# Patient Record
Sex: Female | Born: 1935 | Race: White | Hispanic: No | Smoking: Never smoker
Health system: Southern US, Community
[De-identification: ages and names within clinical notes are randomized; demographics above are authoritative.]

## PROBLEM LIST (undated history)

## (undated) ENCOUNTER — Emergency Department (HOSPITAL_COMMUNITY): Discharge status: Against Medical Advice | Payer: Medicare Other

## (undated) DIAGNOSIS — E785 Hyperlipidemia, unspecified: Secondary | ICD-10-CM

## (undated) DIAGNOSIS — F0390 Unspecified dementia without behavioral disturbance: Secondary | ICD-10-CM

## (undated) DIAGNOSIS — K219 Gastro-esophageal reflux disease without esophagitis: Secondary | ICD-10-CM

## (undated) HISTORY — PX: ABDOMINAL HYSTERECTOMY: SHX81

---

## 2002-03-14 ENCOUNTER — Ambulatory Visit (HOSPITAL_COMMUNITY): Admission: RE | Admit: 2002-03-14 | Discharge: 2002-03-14 | Payer: Self-pay | Admitting: Family Medicine

## 2002-03-14 ENCOUNTER — Encounter: Payer: Self-pay | Admitting: Family Medicine

## 2002-03-15 ENCOUNTER — Encounter: Payer: Self-pay | Admitting: Family Medicine

## 2002-03-15 ENCOUNTER — Ambulatory Visit (HOSPITAL_COMMUNITY): Admission: RE | Admit: 2002-03-15 | Discharge: 2002-03-15 | Payer: Self-pay | Admitting: Family Medicine

## 2002-03-23 ENCOUNTER — Ambulatory Visit (HOSPITAL_COMMUNITY): Admission: RE | Admit: 2002-03-23 | Discharge: 2002-03-23 | Payer: Self-pay | Admitting: General Surgery

## 2002-09-29 ENCOUNTER — Emergency Department (HOSPITAL_COMMUNITY): Admission: EM | Admit: 2002-09-29 | Discharge: 2002-09-29 | Payer: Self-pay | Admitting: Emergency Medicine

## 2002-09-29 ENCOUNTER — Encounter: Payer: Self-pay | Admitting: Emergency Medicine

## 2004-05-29 ENCOUNTER — Other Ambulatory Visit: Admission: RE | Admit: 2004-05-29 | Discharge: 2004-05-29 | Payer: Self-pay | Admitting: Dermatology

## 2005-11-26 ENCOUNTER — Other Ambulatory Visit: Admission: RE | Admit: 2005-11-26 | Discharge: 2005-11-26 | Payer: Self-pay | Admitting: Dermatology

## 2007-07-05 ENCOUNTER — Ambulatory Visit (HOSPITAL_COMMUNITY): Admission: RE | Admit: 2007-07-05 | Discharge: 2007-07-05 | Payer: Self-pay | Admitting: Family Medicine

## 2007-08-03 ENCOUNTER — Encounter (HOSPITAL_COMMUNITY): Admission: RE | Admit: 2007-08-03 | Discharge: 2007-09-02 | Payer: Self-pay | Admitting: Orthopaedic Surgery

## 2007-09-04 ENCOUNTER — Encounter (HOSPITAL_COMMUNITY): Admission: RE | Admit: 2007-09-04 | Discharge: 2007-09-20 | Payer: Self-pay | Admitting: Orthopaedic Surgery

## 2008-06-16 ENCOUNTER — Emergency Department (HOSPITAL_COMMUNITY): Admission: EM | Admit: 2008-06-16 | Discharge: 2008-06-16 | Payer: Self-pay | Admitting: Emergency Medicine

## 2010-08-02 ENCOUNTER — Emergency Department (HOSPITAL_COMMUNITY)
Admission: EM | Admit: 2010-08-02 | Discharge: 2010-08-02 | Payer: Self-pay | Source: Home / Self Care | Admitting: Emergency Medicine

## 2011-01-11 ENCOUNTER — Encounter: Payer: Self-pay | Admitting: Orthopedic Surgery

## 2011-04-11 ENCOUNTER — Emergency Department (HOSPITAL_COMMUNITY): Payer: Medicare Other

## 2011-04-11 ENCOUNTER — Emergency Department (HOSPITAL_COMMUNITY)
Admission: EM | Admit: 2011-04-11 | Discharge: 2011-04-11 | Disposition: A | Discharge status: Home / Self Care | Payer: Medicare Other | Attending: Emergency Medicine | Admitting: Emergency Medicine

## 2011-04-11 DIAGNOSIS — S022XXA Fracture of nasal bones, initial encounter for closed fracture: Secondary | ICD-10-CM | POA: Insufficient documentation

## 2011-04-11 DIAGNOSIS — M542 Cervicalgia: Secondary | ICD-10-CM | POA: Insufficient documentation

## 2011-04-11 DIAGNOSIS — R51 Headache: Secondary | ICD-10-CM | POA: Insufficient documentation

## 2011-04-11 DIAGNOSIS — W1809XA Striking against other object with subsequent fall, initial encounter: Secondary | ICD-10-CM | POA: Insufficient documentation

## 2011-04-11 DIAGNOSIS — W19XXXA Unspecified fall, initial encounter: Secondary | ICD-10-CM

## 2011-04-11 DIAGNOSIS — S0990XA Unspecified injury of head, initial encounter: Secondary | ICD-10-CM

## 2011-05-08 NOTE — H&P (Signed)
[facility]  Patient:    Marisa Lawrence, Marisa Lawrence Visit Number: [id number] MRN: [id number]          Service Type: END Location: DAY Attending Physician:  Dalia Heading Dictated by:   Franky Macho, M.D. Admit Date:  03/23/2002 Discharge Date: 03/23/2002   CC:         Franky Macho, M.D.  Patients chart  Colette Ribas, M.D.   History and Physical  DATE OF BIRTH: [birth year]  CHIEF COMPLAINT:  Gastroesophageal reflux disease.  HISTORY OF PRESENT ILLNESS:  The patient is a 77 year old white female who was referred for evaluation and treatment of GERD.  She has been having intermittent episodes of heartburn for many years.  She states it is somewhat better with Protonix.  No fever or chills have been noted.  No nausea, vomiting, diarrhea, constipation, or epigastric pain have been noted.  No hematochezia has been noted.  PAST MEDICAL HISTORY:  High cholesterol levels.  GERD.  PAST SURGICAL HISTORY:  Unremarkable.  CURRENT MEDICATIONS:  Lipitor, Celebrex, Hydroxyzine, Aciphex.  ALLERGIES:  None.  REVIEW OF SYSTEMS:  Unremarkable.  PHYSICAL EXAMINATION:  GENERAL:  The patient is a well-developed, well-nourished white female in no acute distress.  VITAL SIGNS:  She is afebrile.  Vital signs are stable.  LUNGS:  Clear to auscultation with equal breath sounds bilaterally.  HEART:  Regular rate and rhythm without S3, S4, or murmurs.  ABDOMEN:  Soft, nontender, and nondistended.  No hepatosplenomegaly, masses, or hernias identified.  IMPRESSION:  Gastroesophageal reflux disease.  PLAN:  The patient is scheduled for an EGD on March 23, 2002.  The risks and benefits of the procedure of the procedure including bleeding and perforation were fully explained to and gave informed consent. Dictated by:   Franky Macho, M.D. Attending Physician:  Dalia Heading DD:  03/21/02 TD:  03/21/02 Job: [id number]

## 2011-09-17 LAB — BASIC METABOLIC PANEL
BUN: 16
Calcium: 9.9
GFR calc non Af Amer: >60
Glucose, Bld: 117 — ABNORMAL HIGH
Potassium: 4.1

## 2011-09-17 LAB — DIFFERENTIAL
Basophils Absolute: 0
Eosinophils Relative: 1
Lymphocytes Relative: 12
Lymphs Abs: 1
Neutro Abs: 6.7
Neutrophils Relative %: 81 — ABNORMAL HIGH

## 2011-09-17 LAB — CBC
HCT: 42.3
Platelets: 220
RDW: 13.3
WBC: 8.3

## 2012-04-08 DIAGNOSIS — I10 Essential (primary) hypertension: Secondary | ICD-10-CM | POA: Diagnosis not present

## 2012-04-08 DIAGNOSIS — E119 Type 2 diabetes mellitus without complications: Secondary | ICD-10-CM | POA: Diagnosis not present

## 2012-04-08 DIAGNOSIS — D649 Anemia, unspecified: Secondary | ICD-10-CM | POA: Diagnosis not present

## 2012-04-08 DIAGNOSIS — E559 Vitamin D deficiency, unspecified: Secondary | ICD-10-CM | POA: Diagnosis not present

## 2012-04-08 DIAGNOSIS — Z79899 Other long term (current) drug therapy: Secondary | ICD-10-CM | POA: Diagnosis not present

## 2012-04-08 DIAGNOSIS — R5381 Other malaise: Secondary | ICD-10-CM | POA: Diagnosis not present

## 2012-04-08 DIAGNOSIS — E785 Hyperlipidemia, unspecified: Secondary | ICD-10-CM | POA: Diagnosis not present

## 2012-04-08 DIAGNOSIS — R5383 Other fatigue: Secondary | ICD-10-CM | POA: Diagnosis not present

## 2012-04-08 DIAGNOSIS — IMO0002 Reserved for concepts with insufficient information to code with codable children: Secondary | ICD-10-CM | POA: Diagnosis not present

## 2012-05-04 DIAGNOSIS — L219 Seborrheic dermatitis, unspecified: Secondary | ICD-10-CM | POA: Diagnosis not present

## 2012-05-04 DIAGNOSIS — C44111 Basal cell carcinoma of skin of unspecified eyelid, including canthus: Secondary | ICD-10-CM | POA: Diagnosis not present

## 2012-06-28 DIAGNOSIS — C44111 Basal cell carcinoma of skin of unspecified eyelid, including canthus: Secondary | ICD-10-CM | POA: Diagnosis not present

## 2013-01-27 DIAGNOSIS — E785 Hyperlipidemia, unspecified: Secondary | ICD-10-CM | POA: Diagnosis not present

## 2013-01-27 DIAGNOSIS — IMO0002 Reserved for concepts with insufficient information to code with codable children: Secondary | ICD-10-CM | POA: Diagnosis not present

## 2013-01-27 DIAGNOSIS — I10 Essential (primary) hypertension: Secondary | ICD-10-CM | POA: Diagnosis not present

## 2013-01-27 DIAGNOSIS — R7301 Impaired fasting glucose: Secondary | ICD-10-CM | POA: Diagnosis not present

## 2013-08-17 DIAGNOSIS — Z85828 Personal history of other malignant neoplasm of skin: Secondary | ICD-10-CM | POA: Diagnosis not present

## 2013-08-17 DIAGNOSIS — C44319 Basal cell carcinoma of skin of other parts of face: Secondary | ICD-10-CM | POA: Diagnosis not present

## 2013-08-17 DIAGNOSIS — L570 Actinic keratosis: Secondary | ICD-10-CM | POA: Diagnosis not present

## 2014-01-25 ENCOUNTER — Emergency Department (HOSPITAL_COMMUNITY): Payer: Medicare Other

## 2014-01-25 ENCOUNTER — Encounter (HOSPITAL_COMMUNITY): Payer: Self-pay | Admitting: Emergency Medicine

## 2014-01-25 ENCOUNTER — Emergency Department (HOSPITAL_COMMUNITY)
Admission: EM | Admit: 2014-01-25 | Discharge: 2014-01-25 | Disposition: A | Discharge status: Home / Self Care | Payer: Medicare Other | Attending: Emergency Medicine | Admitting: Emergency Medicine

## 2014-01-25 DIAGNOSIS — Z862 Personal history of diseases of the blood and blood-forming organs and certain disorders involving the immune mechanism: Secondary | ICD-10-CM | POA: Insufficient documentation

## 2014-01-25 DIAGNOSIS — R197 Diarrhea, unspecified: Secondary | ICD-10-CM | POA: Diagnosis not present

## 2014-01-25 DIAGNOSIS — R059 Cough, unspecified: Secondary | ICD-10-CM | POA: Diagnosis not present

## 2014-01-25 DIAGNOSIS — K219 Gastro-esophageal reflux disease without esophagitis: Secondary | ICD-10-CM | POA: Insufficient documentation

## 2014-01-25 DIAGNOSIS — Z79899 Other long term (current) drug therapy: Secondary | ICD-10-CM | POA: Diagnosis not present

## 2014-01-25 DIAGNOSIS — F0390 Unspecified dementia without behavioral disturbance: Secondary | ICD-10-CM | POA: Diagnosis not present

## 2014-01-25 DIAGNOSIS — R05 Cough: Secondary | ICD-10-CM | POA: Diagnosis not present

## 2014-01-25 DIAGNOSIS — J069 Acute upper respiratory infection, unspecified: Secondary | ICD-10-CM | POA: Diagnosis not present

## 2014-01-25 DIAGNOSIS — R5383 Other fatigue: Secondary | ICD-10-CM

## 2014-01-25 DIAGNOSIS — R5381 Other malaise: Secondary | ICD-10-CM | POA: Insufficient documentation

## 2014-01-25 DIAGNOSIS — Z8639 Personal history of other endocrine, nutritional and metabolic disease: Secondary | ICD-10-CM | POA: Insufficient documentation

## 2014-01-25 HISTORY — DX: Hyperlipidemia, unspecified: E78.5

## 2014-01-25 HISTORY — DX: Gastro-esophageal reflux disease without esophagitis: K21.9

## 2014-01-25 HISTORY — DX: Unspecified dementia, unspecified severity, without behavioral disturbance, psychotic disturbance, mood disturbance, and anxiety: F03.90

## 2014-01-25 LAB — CBC WITH DIFFERENTIAL/PLATELET
BASOS PCT: 0 % (ref 0–1)
Basophils Absolute: 0 10*3/uL (ref 0.0–0.1)
EOS ABS: 0 10*3/uL (ref 0.0–0.7)
Eosinophils Relative: 0 % (ref 0–5)
HCT: 42.9 % (ref 36.0–46.0)
Hemoglobin: 14.9 g/dL (ref 12.0–15.0)
Lymphocytes Relative: 19 % (ref 12–46)
Lymphs Abs: 0.8 10*3/uL (ref 0.7–4.0)
MCH: 31.2 pg (ref 26.0–34.0)
MCHC: 34.7 g/dL (ref 30.0–36.0)
MCV: 89.7 fL (ref 78.0–100.0)
MONOS PCT: 9 % (ref 3–12)
Monocytes Absolute: 0.4 10*3/uL (ref 0.1–1.0)
NEUTROS PCT: 72 % (ref 43–77)
Neutro Abs: 3.1 10*3/uL (ref 1.7–7.7)
PLATELETS: 162 10*3/uL (ref 150–400)
RBC: 4.78 MIL/uL (ref 3.87–5.11)
RDW: 13.3 % (ref 11.5–15.5)
WBC: 4.4 10*3/uL (ref 4.0–10.5)

## 2014-01-25 LAB — BASIC METABOLIC PANEL
BUN: 19 mg/dL (ref 6–23)
CO2: 24 mEq/L (ref 19–32)
Calcium: 9.3 mg/dL (ref 8.4–10.5)
Chloride: 95 mEq/L — ABNORMAL LOW (ref 96–112)
Creatinine, Ser: 0.97 mg/dL (ref 0.50–1.10)
GFR, EST AFRICAN AMERICAN: 61 mL/min — AB (ref >90)
GFR, EST NON AFRICAN AMERICAN: 53 mL/min — AB (ref >90)
Glucose, Bld: 107 mg/dL — ABNORMAL HIGH (ref 70–99)
POTASSIUM: 3.8 meq/L (ref 3.7–5.3)
SODIUM: 134 meq/L — AB (ref 137–147)

## 2014-01-25 LAB — URINALYSIS, ROUTINE W REFLEX MICROSCOPIC
BILIRUBIN URINE: NEGATIVE
Glucose, UA: NEGATIVE mg/dL
Hgb urine dipstick: NEGATIVE
Ketones, ur: NEGATIVE mg/dL
Leukocytes, UA: NEGATIVE
NITRITE: NEGATIVE
PROTEIN: NEGATIVE mg/dL
UROBILINOGEN UA: 1 mg/dL (ref 0.0–1.0)
pH: 5.5 (ref 5.0–8.0)

## 2014-01-25 MED ORDER — OSELTAMIVIR PHOSPHATE 75 MG PO CAPS: 75.0000 mg | ORAL_CAPSULE | Freq: Two times a day (BID) | ORAL | Status: DC

## 2014-01-25 MED ORDER — SODIUM CHLORIDE 0.9 % IV BOLUS (SEPSIS)
250.0000 mL | Freq: Once | INTRAVENOUS | Status: AC
  Administered 2014-01-25: 250 mL via INTRAVENOUS

## 2014-01-25 MED ORDER — OSELTAMIVIR PHOSPHATE 75 MG PO CAPS
75.0000 mg | ORAL_CAPSULE | Freq: Once | ORAL | Status: DC
  Filled 2014-01-25: qty 1

## 2014-01-25 NOTE — ED Notes (Signed)
Patient given tamiflu.before leaving....scanner in room does not work

## 2014-01-25 NOTE — ED Notes (Signed)
Patient is back from radiology

## 2014-01-25 NOTE — ED Notes (Signed)
Family reporting URI symptoms and generalized weakness for past 2 days.

## 2014-01-25 NOTE — ED Provider Notes (Signed)
CSN: [id number]     Arrival date & time 01/25/14  2104 History  This chart was scribed for Marisa Greek, MD by Roxan Diesel, ED scribe.  This patient was seen in room APA01/APA01 and the patient's care was started at 9:19 PM.   Chief Complaint  Patient presents with  . URI    The history is provided by a relative. No language interpreter was used.    HPI Comments: Marisa Lawrence is a 80 y.o. female who presents to the Emergency Department complaining of several days of persistent worsening URI symptoms including dry cough, rhinorrhea, and decreased appetite.  Family states that over the past several hours pt has also had worsening generalized weakness.  They also have noticed she seems "swelled under her kneecaps."   She had a small amount of diarrhea yesterday but none since then.  Family denies audible chest congestion or productive cough.  They deny vomiting or measured fever at home.  ED temperature is 99.7 F.    Past Medical History  Diagnosis Date  . Dementia   . Hyperlipidemia   . Acid reflux     History reviewed. No pertinent past surgical history.  History reviewed. No pertinent family history.   History  Substance Use Topics  . Smoking status: Never Smoker   . Smokeless tobacco: Not on file  . Alcohol Use: No    OB History   Grav Para Term Preterm Abortions TAB SAB Ect Mult Living                  Review of Systems  Constitutional: Negative for fever.  HENT: Positive for rhinorrhea.   Respiratory: Positive for cough.   Gastrointestinal: Positive for diarrhea. Negative for vomiting.  Neurological: Positive for weakness.  All other systems reviewed and are negative.     Allergies  Review of patient's allergies indicates not on file.  Home Medications   Current Outpatient Rx  Name  Route  Sig  Dispense  Refill  . donepezil (ARICEPT) 10 MG tablet   Oral   Take 10 mg by mouth at bedtime.         . RABEprazole (ACIPHEX) 20 MG tablet    Oral   Take 20 mg by mouth daily.          BP 112/82  Pulse 90  Temp(Src) 99.7 F (37.6 C) (Oral)  Resp 18  Ht 5\' 4"  (1.626 m)  Wt 115 lb (52.164 kg)  BMI 19.73 kg/m2  SpO2 100%  Physical Exam  Nursing note and vitals reviewed. Constitutional: She appears well-developed and well-nourished. No distress.  HENT:  Head: Normocephalic and atraumatic.  Right Ear: Hearing normal.  Left Ear: Hearing normal.  Nose: Nose normal.  Mouth/Throat: Oropharynx is clear and moist and mucous membranes are normal.  Eyes: Conjunctivae and EOM are normal. Pupils are equal, round, and reactive to light.  Neck: Normal range of motion. Neck supple.  Cardiovascular: Regular rhythm, S1 normal and S2 normal.  Exam reveals no gallop and no friction rub.   No murmur heard. Pulmonary/Chest: Effort normal and breath sounds normal. No respiratory distress. She exhibits no tenderness.  Abdominal: Soft. Normal appearance and bowel sounds are normal. There is no hepatosplenomegaly. There is no tenderness. There is no rebound, no guarding, no tenderness at McBurney's point and negative Murphy's sign. No hernia.  Musculoskeletal: Normal range of motion.  Neurological: She is alert. She has normal strength. No cranial nerve deficit or sensory deficit. Coordination  normal. GCS eye subscore is 4. GCS verbal subscore is 5. GCS motor subscore is 6.  Skin: Skin is warm, dry and intact. No rash noted. No cyanosis.  Psychiatric: She has a normal mood and affect. Her speech is normal and behavior is normal. Thought content normal.    ED Course  Procedures (including critical care time)  DIAGNOSTIC STUDIES: Oxygen Saturation is 100% on room air, normal by my interpretation.    COORDINATION OF CARE: 9:20 PM-Discussed treatment plan which includes IV fluids, labs, UA, and CXR with pt's family at bedside and they agreed to plan.    Labs Review Labs Reviewed - No data to display  Imaging Review No results  found.  EKG Interpretation   None       MDM  Diagnosis: Upper respiratory infection  Patient presents to the ER for evaluation of upper respiratory infection. She has had cough and congestion. Tonight she has had decreased activity, seems weakened. They have not noticed any fever at home. No vomiting or diarrhea. Examination was unremarkable. Blood work is normal. Urinalysis did not show any sign of infection. Chest x-ray is clear. Symptoms consistent with upper respiratory infection, likely viral in nature. Will initiate empiric Tamiflu. Otherwise symptomatic treatment.   I personally performed the services described in this documentation, which was scribed in my presence. The recorded information has been reviewed and is accurate.    Marisa Greek, MD 01/25/14 2227

## 2014-01-25 NOTE — Discharge Instructions (Signed)

## 2014-01-29 DIAGNOSIS — IMO0002 Reserved for concepts with insufficient information to code with codable children: Secondary | ICD-10-CM | POA: Diagnosis not present

## 2014-01-29 DIAGNOSIS — Z0000 Encounter for general adult medical examination without abnormal findings: Secondary | ICD-10-CM | POA: Diagnosis not present

## 2014-01-29 DIAGNOSIS — R7301 Impaired fasting glucose: Secondary | ICD-10-CM | POA: Diagnosis not present

## 2014-09-17 DIAGNOSIS — IMO0002 Reserved for concepts with insufficient information to code with codable children: Secondary | ICD-10-CM | POA: Diagnosis not present

## 2014-09-17 DIAGNOSIS — K219 Gastro-esophageal reflux disease without esophagitis: Secondary | ICD-10-CM | POA: Diagnosis not present

## 2014-09-17 DIAGNOSIS — R143 Flatulence: Secondary | ICD-10-CM | POA: Diagnosis not present

## 2014-09-17 DIAGNOSIS — R141 Gas pain: Secondary | ICD-10-CM | POA: Diagnosis not present

## 2015-02-14 DIAGNOSIS — Z1389 Encounter for screening for other disorder: Secondary | ICD-10-CM | POA: Diagnosis not present

## 2015-02-14 DIAGNOSIS — R7301 Impaired fasting glucose: Secondary | ICD-10-CM | POA: Diagnosis not present

## 2015-02-14 DIAGNOSIS — K219 Gastro-esophageal reflux disease without esophagitis: Secondary | ICD-10-CM | POA: Diagnosis not present

## 2015-02-14 DIAGNOSIS — Z0000 Encounter for general adult medical examination without abnormal findings: Secondary | ICD-10-CM | POA: Diagnosis not present

## 2015-02-14 DIAGNOSIS — Z6822 Body mass index (BMI) 22.0-22.9, adult: Secondary | ICD-10-CM | POA: Diagnosis not present

## 2015-02-14 DIAGNOSIS — E785 Hyperlipidemia, unspecified: Secondary | ICD-10-CM | POA: Diagnosis not present

## 2015-12-02 DIAGNOSIS — Z6824 Body mass index (BMI) 24.0-24.9, adult: Secondary | ICD-10-CM | POA: Diagnosis not present

## 2015-12-02 DIAGNOSIS — M545 Low back pain: Secondary | ICD-10-CM | POA: Diagnosis not present

## 2015-12-02 DIAGNOSIS — Z1389 Encounter for screening for other disorder: Secondary | ICD-10-CM | POA: Diagnosis not present

## 2015-12-02 DIAGNOSIS — R8290 Unspecified abnormal findings in urine: Secondary | ICD-10-CM | POA: Diagnosis not present

## 2015-12-03 DIAGNOSIS — R8290 Unspecified abnormal findings in urine: Secondary | ICD-10-CM | POA: Diagnosis not present

## 2016-02-12 DIAGNOSIS — E782 Mixed hyperlipidemia: Secondary | ICD-10-CM | POA: Diagnosis not present

## 2016-02-12 DIAGNOSIS — K219 Gastro-esophageal reflux disease without esophagitis: Secondary | ICD-10-CM | POA: Diagnosis not present

## 2016-02-12 DIAGNOSIS — Z6823 Body mass index (BMI) 23.0-23.9, adult: Secondary | ICD-10-CM | POA: Diagnosis not present

## 2016-02-12 DIAGNOSIS — Z0000 Encounter for general adult medical examination without abnormal findings: Secondary | ICD-10-CM | POA: Diagnosis not present

## 2016-02-12 DIAGNOSIS — H6123 Impacted cerumen, bilateral: Secondary | ICD-10-CM | POA: Diagnosis not present

## 2016-02-12 DIAGNOSIS — Z1389 Encounter for screening for other disorder: Secondary | ICD-10-CM | POA: Diagnosis not present

## 2016-04-14 DIAGNOSIS — J069 Acute upper respiratory infection, unspecified: Secondary | ICD-10-CM | POA: Diagnosis not present

## 2016-04-14 DIAGNOSIS — Z1389 Encounter for screening for other disorder: Secondary | ICD-10-CM | POA: Diagnosis not present

## 2016-04-14 DIAGNOSIS — Z6823 Body mass index (BMI) 23.0-23.9, adult: Secondary | ICD-10-CM | POA: Diagnosis not present

## 2017-03-11 ENCOUNTER — Emergency Department (HOSPITAL_COMMUNITY): Payer: Medicare Other

## 2017-03-11 ENCOUNTER — Encounter (HOSPITAL_COMMUNITY): Payer: Self-pay | Admitting: Emergency Medicine

## 2017-03-11 ENCOUNTER — Emergency Department (HOSPITAL_COMMUNITY)
Admission: EM | Admit: 2017-03-11 | Discharge: 2017-03-11 | Disposition: A | Discharge status: Home / Self Care | Payer: Medicare Other | Attending: Emergency Medicine | Admitting: Emergency Medicine

## 2017-03-11 DIAGNOSIS — Y92009 Unspecified place in unspecified non-institutional (private) residence as the place of occurrence of the external cause: Secondary | ICD-10-CM | POA: Diagnosis not present

## 2017-03-11 DIAGNOSIS — F0390 Unspecified dementia without behavioral disturbance: Secondary | ICD-10-CM | POA: Diagnosis not present

## 2017-03-11 DIAGNOSIS — S42212A Unspecified displaced fracture of surgical neck of left humerus, initial encounter for closed fracture: Secondary | ICD-10-CM | POA: Diagnosis not present

## 2017-03-11 DIAGNOSIS — S42202A Unspecified fracture of upper end of left humerus, initial encounter for closed fracture: Secondary | ICD-10-CM | POA: Diagnosis not present

## 2017-03-11 DIAGNOSIS — Y999 Unspecified external cause status: Secondary | ICD-10-CM | POA: Diagnosis not present

## 2017-03-11 DIAGNOSIS — Y939 Activity, unspecified: Secondary | ICD-10-CM | POA: Diagnosis not present

## 2017-03-11 DIAGNOSIS — S60415A Abrasion of left ring finger, initial encounter: Secondary | ICD-10-CM | POA: Diagnosis not present

## 2017-03-11 DIAGNOSIS — Z23 Encounter for immunization: Secondary | ICD-10-CM | POA: Diagnosis not present

## 2017-03-11 DIAGNOSIS — W1839XA Other fall on same level, initial encounter: Secondary | ICD-10-CM | POA: Diagnosis not present

## 2017-03-11 DIAGNOSIS — S62617A Displaced fracture of proximal phalanx of left little finger, initial encounter for closed fracture: Secondary | ICD-10-CM | POA: Diagnosis not present

## 2017-03-11 DIAGNOSIS — W19XXXA Unspecified fall, initial encounter: Secondary | ICD-10-CM

## 2017-03-11 DIAGNOSIS — S6992XA Unspecified injury of left wrist, hand and finger(s), initial encounter: Secondary | ICD-10-CM | POA: Diagnosis present

## 2017-03-11 DIAGNOSIS — S42292A Other displaced fracture of upper end of left humerus, initial encounter for closed fracture: Secondary | ICD-10-CM | POA: Diagnosis not present

## 2017-03-11 MED ORDER — ACETAMINOPHEN 325 MG PO TABS
650.0000 mg | ORAL_TABLET | Freq: Once | ORAL | Status: AC
  Administered 2017-03-11: 650 mg via ORAL
  Filled 2017-03-11: qty 2

## 2017-03-11 MED ORDER — TETANUS-DIPHTH-ACELL PERTUSSIS 5-2.5-18.5 LF-MCG/0.5 IM SUSP
0.5000 mL | Freq: Once | INTRAMUSCULAR | Status: AC
  Administered 2017-03-11: 0.5 mL via INTRAMUSCULAR
  Filled 2017-03-11: qty 0.5

## 2017-03-11 NOTE — ED Triage Notes (Signed)
Patient fell today per family and landed on left arm. Denies hitting head or LOC. Patient c/o left arm and shoulder pain. Patient states unable to move arm. Radial pulse present. No obvious deformity noted.

## 2017-03-11 NOTE — ED Provider Notes (Signed)
[facility] DEPT Provider Note   CSN: [id number] Arrival date & time: 03/11/17  1159  By signing my name below, I, Marisa Lawrence, attest that this documentation has been prepared under the direction and in the presence of Orlie Dakin, MD. Electronically Signed: Ethelle Lyon Lawrence, Scribe. 03/11/2017. 1:59 PM.  History   Chief Complaint Chief Complaint  Patient presents with  . Fall   LEVEL 5 CAVEAT DUE TO DEMENTIA The history is provided by medical records and a relative. The history is limited by the condition of the patient. No language interpreter was used.   History from patient's daughter HPI Comments:  Marisa Lawrence is a 83 y.o. female with a PMHx of Dementia, HLD, and Acid Reflux, who presents to the Emergency Department complaining of sudden onset left arm pain s/p a ground level fall this afternoon around 12:00PM. Pt's family reports the pt fell and struck her left arm and left shoulder on the ground but they are unsure what caused the fall. . Her daughter reports she has a cane that she is supposed to use to ambulate but refuses to use it. Direct Pressure and Palpation exacerbate her pain. Last TDAP <10 years ago.    Past Medical History:  Diagnosis Date  . Acid reflux   . Dementia   . Hyperlipidemia    There are no active problems to display for this patient.  History reviewed. No pertinent surgical history.  OB History    Gravida Para Term Preterm AB Living             4   SAB TAB Ectopic Multiple Live Births                 Home Medications    Prior to Admission medications   Medication Sig Start Date End Date Taking? Authorizing Provider  donepezil (ARICEPT) 5 MG tablet Take 1 tablet by mouth daily. 01/31/17   Historical Provider, MD  oseltamivir (TAMIFLU) 75 MG capsule Take 1 capsule (75 mg total) by mouth every 12 (twelve) hours. 01/25/14   [name], MD  pantoprazole (PROTONIX) 40 MG tablet Take 1 tablet by mouth daily. 03/05/17    Historical Provider, MD  RABEprazole (ACIPHEX) 20 MG tablet Take 20 mg by mouth daily.    Historical Provider, MD   Family History History reviewed. No pertinent family history.  Social History Social History  Substance Use Topics  . Smoking status: Never Smoker  . Smokeless tobacco: Never Used  . Alcohol use No    Allergies   Patient has no known allergies.  Review of Systems Review of Systems  Unable to perform ROS: Dementia  Musculoskeletal: Positive for arthralgias and gait problem.       Walks with cane   Physical Exam Updated Vital Signs Ht 5\' 3"  (1.6 m)   Wt 115 lb (52.2 kg)   BMI 20.37 kg/m   Physical Exam  Constitutional: She appears well-developed and well-nourished.  HENT:  Head: Normocephalic and atraumatic.  Eyes: EOM are normal.  Neck: Neck supple. No tracheal deviation present. No thyromegaly present.  Cardiovascular: Normal rate and regular rhythm.   No murmur heard. Pulmonary/Chest: Effort normal and breath sounds normal.  Abdominal: Soft. Bowel sounds are normal. She exhibits no distension. There is no tenderness.  Musculoskeletal: Normal range of motion. She exhibits no edema or tenderness.  Upper extremity tender at shoulder. Limited range of motion shoulder secondary to pain. There is a tiny abrasion at the ring finger.  She is swollen tender ecchymotic at the fifth MCP joint, Dorsal dorsal aspect. Radial pulse 2+. Entire spine nontender. Pelvis stable nontender. All other extremities without deformity or swelling, neurovascularly intact  Neurological: She is alert. Coordination normal.  Skin: Skin is warm and dry. No rash noted.  Psychiatric:  Pleasantly confused  Nursing note and vitals reviewed.  ED Treatments / Results  DIAGNOSTIC STUDIES:  Oxygen Saturation is 100% on room air, normal by my interpretation.    COORDINATION OF CARE:  1:55 PM Discussed treatment plan with pt and family at bedside including XR of Left Shoulder and Left Hand  with Tylenol and arm sling and they agreed to plan. Pt to leave ED by way of family member. X-rays reviewed by me Results for orders placed or performed during the hospital encounter of 01/25/14  CBC with Differential  Result Value Ref Range   WBC 4.4 4.0 - 10.5 K/uL   RBC 4.78 3.87 - 5.11 MIL/uL   Hemoglobin 14.9 12.0 - 15.0 g/dL   HCT 42.9 36.0 - 46.0 %   MCV 89.7 78.0 - 100.0 fL   MCH 31.2 26.0 - 34.0 pg   MCHC 34.7 30.0 - 36.0 g/dL   RDW 13.3 11.5 - 15.5 %   Platelets 162 150 - 400 K/uL   Neutrophils Relative % 72 43 - 77 %   Neutro Abs 3.1 1.7 - 7.7 K/uL   Lymphocytes Relative 19 12 - 46 %   Lymphs Abs 0.8 0.7 - 4.0 K/uL   Monocytes Relative 9 3 - 12 %   Monocytes Absolute 0.4 0.1 - 1.0 K/uL   Eosinophils Relative 0 0 - 5 %   Eosinophils Absolute 0.0 0.0 - 0.7 K/uL   Basophils Relative 0 0 - 1 %   Basophils Absolute 0.0 0.0 - 0.1 K/uL  Basic metabolic panel  Result Value Ref Range   Sodium 134 (L) 137 - 147 mEq/L   Potassium 3.8 3.7 - 5.3 mEq/L   Chloride 95 (L) 96 - 112 mEq/L   CO2 24 19 - 32 mEq/L   Glucose, Bld 107 (H) 70 - 99 mg/dL   BUN 19 6 - 23 mg/dL   Creatinine, Ser 0.97 0.50 - 1.10 mg/dL   Calcium 9.3 8.4 - 10.5 mg/dL   GFR calc non Af Amer 53 (L) >90 mL/min   GFR calc Af Amer 61 (L) >90 mL/min  Urinalysis, Routine w reflex microscopic  Result Value Ref Range   Color, Urine YELLOW YELLOW   APPearance CLEAR CLEAR   Specific Gravity, Urine >1.030 (H) 1.005 - 1.030   pH 5.5 5.0 - 8.0   Glucose, UA NEGATIVE NEGATIVE mg/dL   Hgb urine dipstick NEGATIVE NEGATIVE   Bilirubin Urine NEGATIVE NEGATIVE   Ketones, ur NEGATIVE NEGATIVE mg/dL   Protein, ur NEGATIVE NEGATIVE mg/dL   Urobilinogen, UA 1.0 0.0 - 1.0 mg/dL   Nitrite NEGATIVE NEGATIVE   Leukocytes, UA NEGATIVE NEGATIVE   Dg Shoulder Left  Result Date: 03/11/2017 CLINICAL DATA:  Recent fall with shoulder pain, initial encounter EXAM: LEFT SHOULDER - 2+ VIEW COMPARISON:  None. FINDINGS: There is a  fracture the proximal left humerus involving the surgical neck with impaction at the fracture site. There is also avulsion of the greater tuberosity. The fracture fragments likely extend into the more superior humeral head. No dislocation is noted. IMPRESSION: Fractured impaction in the proximal left humerus involving primarily the surgical neck. Electronically Signed   By: Linus Mako.D.  On: 03/11/2017 12:49   Dg Hand Complete Left  Result Date: 03/11/2017 CLINICAL DATA:  Pain following fall EXAM: LEFT HAND - COMPLETE 3+ VIEW COMPARISON:  None. FINDINGS: Frontal, oblique, and lateral views were obtained. There is a fracture of the proximal aspect of the fifth proximal phalanx with alignment near anatomic. No other fracture. No dislocation. Bones are markedly osteoporotic. There is extensive osteoarthritic change in all MCP, PIP, and DIP joints. There is also osteoarthritic change in the scaphotrapezial and first carpal -metacarpal joints. No erosive change. IMPRESSION: Fracture proximal aspect fifth proximal phalanx with alignment near anatomic. No other fracture. No dislocation. Multifocal osteoarthritic change. Bones diffusely osteoporotic. Electronically Signed   By: Lowella Grip III M.D.   On: 03/11/2017 14:50   Labs (all labs ordered are listed, but only abnormal results are displayed) Labs Reviewed - No data to display  EKG  EKG Interpretation None       Radiology Dg Shoulder Left  Result Date: 03/11/2017 CLINICAL DATA:  Recent fall with shoulder pain, initial encounter EXAM: LEFT SHOULDER - 2+ VIEW COMPARISON:  None. FINDINGS: There is a fracture the proximal left humerus involving the surgical neck with impaction at the fracture site. There is also avulsion of the greater tuberosity. The fracture fragments likely extend into the more superior humeral head. No dislocation is noted. IMPRESSION: Fractured impaction in the proximal left humerus involving primarily the surgical  neck. Electronically Signed   By: Inez Catalina M.D.   On: 03/11/2017 12:49    Procedures Procedures (including critical care time)  Medications Ordered in ED Medications - No data to display   Initial Impression / Assessment and Plan / ED Course  I have reviewed the triage vital signs and the nursing notes.  Pertinent labs & imaging results that were available during my care of the patient were reviewed by me and considered in my medical decision making (see chart for details).     Ulnar gutter Splint and shoulder immobilizer ice on patient by nurse. 3:50 PM patient left upper extremity with radial pulse 2+ and good capillary refill. Admits to Tylenol for pain. Plan follow-up with Dr. Aline Brochure from orthopedics. Appointment has been scheduled for patient for April 3.  Final Clinical Impressions(s) / ED Diagnoses  Agnes #1 fall #2 closed fracture of left proximal humerus #3 closed fracture of left hand #4 abrasion of finger Final diagnoses:  None    New Prescriptions New Prescriptions   No medications on file   I personally performed the services described in this documentation, which was scribed in my presence. The recorded information has been reviewed and considered.     Orlie Dakin, MD 03/11/17 1558

## 2017-03-11 NOTE — ED Notes (Signed)
Upon discharge, wheelchair brought into room.  The nurse, tech, and another tech all offered to take patient out in the wheelchair while the daughter brought the car around.  Daughter denied all help and stated that she would wheel her mother out in the wheelchair.  Daphyne, Nurse First, advised that daughter came back into the hospital after getting her mother into the car and advised:  "she almost fell again".

## 2017-03-11 NOTE — ED Notes (Signed)
nad noted prior to dc. Dc instructions reviewed with daughter.

## 2017-03-11 NOTE — ED Notes (Signed)
Ice pack given to pt. Daughter at bsd. Shoulder immobilizer placed. Resting in bed with nad noted.

## 2017-03-11 NOTE — Discharge Instructions (Signed)
X-rays show broken bones at shoulder and at left hand. Marisa Lawrence can have Tylenol 650 mg every 4 hours as needed for pain. Place an ice pack on the painful areas 4 times daily for 30 minutes at a time. Keep your scheduled plan with Dr. Aline Brochure on 03/23/2017

## 2017-03-23 ENCOUNTER — Ambulatory Visit (INDEPENDENT_AMBULATORY_CARE_PROVIDER_SITE_OTHER): Payer: Medicare Other

## 2017-03-23 ENCOUNTER — Ambulatory Visit (INDEPENDENT_AMBULATORY_CARE_PROVIDER_SITE_OTHER): Payer: Medicare Other | Admitting: Orthopedic Surgery

## 2017-03-23 ENCOUNTER — Encounter: Payer: Self-pay | Admitting: Orthopedic Surgery

## 2017-03-23 VITALS — BP 147/83 | HR 79 | Ht 61.0 in | Wt 122.0 lb

## 2017-03-23 DIAGNOSIS — S42202A Unspecified fracture of upper end of left humerus, initial encounter for closed fracture: Secondary | ICD-10-CM

## 2017-03-23 DIAGNOSIS — S6292XA Unspecified fracture of left wrist and hand, initial encounter for closed fracture: Secondary | ICD-10-CM

## 2017-03-23 NOTE — Patient Instructions (Signed)
WEAR SLING X 2 WEEKS

## 2017-03-23 NOTE — Progress Notes (Signed)
NEW PATIENT   Chief Complaint  Patient presents with  . Arm Injury    LEFT PROXIMAL HUMERUS FRACTURE, DOI 03/09/17  . Hand Injury    LEFT 5TH PHALANX FRACTURE, DOI 03/09/17    83 YO FELL AT HOME ON 3/22  SHE CO PAIN LEFT HAND AND LEFT SHOULDER   ER- XRAYS SHOW FRAX PROXIMAL HUM AND LEFT 5TH DIGIT PROX PHAL  SHE complains of mild discomfort at the left hand left shoulder. She has dementia her history is very incomplete but her daughter indicates Tylenol has relieved the pain   Review of Systems  Unable to perform ROS: Dementia   Past Medical History:  Diagnosis Date  . Acid reflux   . Dementia   . Hyperlipidemia    Previous hysterectomy  BP (!) 147/83   Pulse 79   Ht 5\' 1"  (1.549 m)   Wt 122 lb (55.3 kg)   BMI 23.05 kg/m   Gen. appearance frail elderly female appears to have adequate nutrition status and small body habitus grooming normal.  Cardiovascular peripheral vascular system no swelling mild varicosities and temperature of her left hand and arm is normal   Lymph nodes are negative to palpation  Gait and station she ambulates without assistive devices she has a sling on her left arm in a splint on her left hand both are removed for examination  Left hand tenderness at the proximal phalanx without malalignment she has minimal range of motion at the M metacarpophalangeal joint of the small finger there is no dislocation subluxation noted there are arthritic nodules noted in the small digits and small joints muscle tone is normal  Left proximal humerus tender very painful passive range of motion which is limited by pain there is no dislocation muscle strength and tone are normal  Ecchymosis is noted in the hand and proximal humerus from the fracture hematomas  Mood and affect are flat orientation to time person and place she is disoriented to all 3 normal sensation is noted in the left hand.  Repeat x-rays compared to x-rays taken March 22 sure proximal phalanx  fracture of the left small finger nondisplaced today x-ray shows no displacement of the fracture  She is a proximal humerus fracture which on lateral x-ray showed has apex anterior angulation.  However, based on age and activity level and mental status patient is a good candidate for nonoperative treatment her pain is controlled with Tylenol  Recommend continue sling, we can remove the splint on the hand  X-ray in 2 weeks both injuries  Encounter Diagnoses  Name Primary?  . Closed fracture of proximal end of left humerus, unspecified fracture morphology, initial encounter Yes  . Closed fracture of left hand, initial encounter

## 2017-03-24 ENCOUNTER — Telehealth: Payer: Self-pay | Admitting: Orthopedic Surgery

## 2017-03-24 ENCOUNTER — Telehealth: Payer: Self-pay | Admitting: Orthopaedic Surgery

## 2017-03-24 NOTE — Telephone Encounter (Signed)
Patient's daughter and designated contact, Soledad Gerlach, PH# [phone], called to relay that patient is having discomfort with "just the sling". Said that since splint was removed yesterday, 03/23/17 (office visit) her left hand/arm is "dangling" and therefore uncomfortable today.  Asking if there is some type of other support to help her to be more comfortable and more supported.

## 2017-03-24 NOTE — Telephone Encounter (Signed)
Re-entering note of 03/24/17 under Dr Arther Abbott, the provider seen at our site, [facility]

## 2017-03-24 NOTE — Telephone Encounter (Signed)
ROUTING TO DR HARRISON TO ADVISE 

## 2017-03-25 NOTE — Telephone Encounter (Signed)
No there is not

## 2017-03-25 NOTE — Telephone Encounter (Signed)
Daughter aware.

## 2017-03-29 DIAGNOSIS — Z6822 Body mass index (BMI) 22.0-22.9, adult: Secondary | ICD-10-CM | POA: Diagnosis not present

## 2017-03-29 DIAGNOSIS — F0390 Unspecified dementia without behavioral disturbance: Secondary | ICD-10-CM | POA: Diagnosis not present

## 2017-03-29 DIAGNOSIS — Z1389 Encounter for screening for other disorder: Secondary | ICD-10-CM | POA: Diagnosis not present

## 2017-03-29 DIAGNOSIS — K219 Gastro-esophageal reflux disease without esophagitis: Secondary | ICD-10-CM | POA: Diagnosis not present

## 2017-03-29 DIAGNOSIS — E785 Hyperlipidemia, unspecified: Secondary | ICD-10-CM | POA: Diagnosis not present

## 2017-03-29 DIAGNOSIS — Z0001 Encounter for general adult medical examination with abnormal findings: Secondary | ICD-10-CM | POA: Diagnosis not present

## 2017-04-09 ENCOUNTER — Encounter: Payer: Self-pay | Admitting: Orthopedic Surgery

## 2017-04-09 ENCOUNTER — Ambulatory Visit (INDEPENDENT_AMBULATORY_CARE_PROVIDER_SITE_OTHER): Payer: Medicare Other

## 2017-04-09 ENCOUNTER — Ambulatory Visit (INDEPENDENT_AMBULATORY_CARE_PROVIDER_SITE_OTHER): Payer: Self-pay | Admitting: Orthopedic Surgery

## 2017-04-09 DIAGNOSIS — S42202D Unspecified fracture of upper end of left humerus, subsequent encounter for fracture with routine healing: Secondary | ICD-10-CM

## 2017-04-09 DIAGNOSIS — S62607D Fracture of unspecified phalanx of left little finger, subsequent encounter for fracture with routine healing: Secondary | ICD-10-CM

## 2017-04-09 NOTE — Progress Notes (Signed)
Follow-up status post left proximal humerus fracture and left small finger fracture on 03/09/2017  Both fractures were treated by closed means  She is doing fine at this point she can move her hand well she still has stiffness in the left shoulder she is in a sling no splinting is noted at this time and a small finger.  X-rays show healing of the proximal humerus fracture with slight angulation  Her left small finger has full range of motion is no deformity  She has limited passive range of motion of the left shoulder with x-ray again showing healing  Start physical therapy twice a week for 6 weeks with occupational therapy as an outpatient  Follow-up 6 weeks  Encounter Diagnoses  Name Primary?  . Closed fracture of proximal end of left humerus with routine healing, unspecified fracture morphology, subsequent encounter Yes  . Closed displaced fracture of phalanx of left little finger with routine healing, unspecified phalanx, subsequent encounter

## 2017-04-15 ENCOUNTER — Ambulatory Visit (HOSPITAL_COMMUNITY): Payer: Medicare Other | Admitting: Occupational Therapy

## 2017-04-22 ENCOUNTER — Ambulatory Visit (HOSPITAL_COMMUNITY): Payer: Medicare Other | Attending: Orthopedic Surgery | Admitting: Occupational Therapy

## 2017-04-22 ENCOUNTER — Encounter (HOSPITAL_COMMUNITY): Payer: Self-pay | Admitting: Occupational Therapy

## 2017-04-22 DIAGNOSIS — M25612 Stiffness of left shoulder, not elsewhere classified: Secondary | ICD-10-CM | POA: Diagnosis not present

## 2017-04-22 DIAGNOSIS — R29898 Other symptoms and signs involving the musculoskeletal system: Secondary | ICD-10-CM | POA: Diagnosis not present

## 2017-04-22 DIAGNOSIS — M25512 Pain in left shoulder: Secondary | ICD-10-CM | POA: Insufficient documentation

## 2017-04-22 NOTE — Therapy (Signed)
[place], [place], [address] Phone: [phone]   Fax:  [phone]  Occupational Therapy Evaluation  Patient Details  Name: Marisa Lawrence MRN: [id number] Date of Birth: [birth year] Referring Provider: Dr. Arther Abbott  Encounter Date: 04/22/2017      OT End of Session - 04/22/17 1201    Visit Number 1   Number of Visits 16   Date for OT Re-Evaluation 06/21/17  mini-reassessment 05/21/2017   Authorization Type 1) Medicare part A & B 2) [employer]   Authorization Time Period Before 10th visit   Authorization - Visit Number 1   Authorization - Number of Visits 10   OT Start Time 2025   OT Stop Time 1152   OT Time Calculation (min) 26 min   Activity Tolerance Patient tolerated treatment well   Behavior During Therapy [facility] for tasks assessed/performed      Past Medical History:  Diagnosis Date  . Acid reflux   . Dementia   . Hyperlipidemia     No past surgical history on file.  There were no vitals filed for this visit.      Subjective Assessment - 04/22/17 1159    Subjective  S: I fell at home.    Patient is accompained by: Family member  Abigail Butts   Pertinent History Pt is an 83 y/o female s/p left proximal humerus fracture on 03/11/17 after a mechanical fall in her home. Pt wore a sling for 4 weeks, which is now discontinued. Dr. Arther Abbott has referred pt to occupational therapy for evaluation and treatment.    Limitations Hx of dementia   Special Tests FOTO not completed due to cogntive status   Patient Stated Goals I want to use my arm.    Currently in Pain? No/denies           [facility] OT Assessment - 04/22/17 1126      Assessment   Diagnosis Left proximal humerus fracture   Referring Provider Dr. Arther Abbott   Onset Date 03/04/17   Prior Therapy None     Precautions   Precautions None     Balance Screen   Has the patient fallen in the past 6 months Yes   How many times? 1    Has the patient had a decrease in activity level because of a fear of falling?  Yes   Is the patient reluctant to leave their home because of a fear of falling?  No     Prior Function   Level of Independence Independent with basic ADLs   Vocation Retired   Leisure watching tv, sitting on front porch     ADL   ADL comments Pt is having difficulty with dressing, grooming, reaching overhead. lifting items     Written Expression   Dominant Hand Right     Cognition   Overall Cognitive Status Within Functional Limits for tasks assessed     ROM / Strength   AROM / PROM / Strength AROM;PROM;Strength     Palpation   Palpation comment Moderate fascial restrictions in left upper arm, trapezius, and scapularis regions     AROM   Overall AROM Comments Assessed seated, er/IR adducted   AROM Assessment Site Shoulder   Right/Left Shoulder Left   Left Shoulder Flexion 48 Degrees   Left Shoulder ABduction 45 Degrees   Left Shoulder Internal Rotation 90 Degrees   Left Shoulder External Rotation 35 Degrees     PROM  Overall PROM Comments Assessed supine, er/IR adducted   PROM Assessment Site Shoulder   Right/Left Shoulder Left   Left Shoulder Flexion 105 Degrees   Left Shoulder ABduction 75 Degrees   Left Shoulder Internal Rotation 90 Degrees   Left Shoulder External Rotation 38 Degrees     Strength   Overall Strength Comments Assessed seated, er/IR adducted   Strength Assessment Site Shoulder   Right/Left Shoulder Left   Left Shoulder Flexion 3-/5   Left Shoulder ABduction 3-/5   Left Shoulder Internal Rotation 3/5   Left Shoulder External Rotation 3-/5                         OT Education - 04/22/17 1143    Education provided Yes   Education Details table slides   Person(s) Educated Patient;Child(ren)  Abigail Butts   Methods Explanation;Demonstration;Handout   Comprehension Verbalized understanding;Returned demonstration;Verbal cues required          OT Short  Term Goals - 04/22/17 1206      OT SHORT TERM GOAL #1   Title Pt will be educated on HEP to improve use of LUE during daily activities.    Time 4   Period Weeks   Status New     OT SHORT TERM GOAL #2   Title Pt will decrease LUE fascial restrictions from mod to min amounts to improve mobility required for dressing.    Time 4   Period Weeks   Status New     OT SHORT TERM GOAL #3   Title Pt will decrease LUE pain to 4/10 to improve ability to use LUE as assist during daily tasks.    Time 4   Period Weeks   Status New     OT SHORT TERM GOAL #4   Title Pt will increase LUE P/ROM to [facility] to improve ability to use LUE as assist during bathing tasks.    Time 4   Period Weeks   Status New     OT SHORT TERM GOAL #5   Title Pt will increase LUE strength to 3+/5 to improve ability to use LUE when pushing up from chairs/bed.    Time 4   Period Weeks   Status New           OT Long Term Goals - 04/22/17 1208      OT LONG TERM GOAL #1   Title Pt will return to highest level of functioning and independence using LUE during ADL completion.    Time 8   Period Weeks   Status New     OT LONG TERM GOAL #2   Title Pt will decrease LUE fascial restrictions from min to trace amounts to improve mobility required for overhead reaching.    Time 8   Period Weeks   Status New     OT LONG TERM GOAL #3   Title Pt will decrease LUE pain to 2/10 to improve ability to use LUE during grooming tasks.    Time 8   Period Weeks   Status New     OT LONG TERM GOAL #4   Title Pt will increase LUE A/ROM to [facility] to improve ability to reach into overhead cabinets.    Time 8   Period Weeks   Status New     OT LONG TERM GOAL #5   Title Pt will increase LUE strength to 4/5 to improve ability to use LUE to lift lightweight items.  Time 8   Period Weeks   Status New               Plan - 2017/05/13 1202    Clinical Impression Statement A: Pt is an 83 y/o female s/p left proximal humerus  fracture presenting with pain, fascial restrictions, ROM and strength deficits limiting functional use of LUE during daily tasks. Pt provided with table slide HEP, daughter educated as well due to pt dementia status.    Rehab Potential Good   OT Frequency 2x / week   OT Duration 6 weeks   OT Treatment/Interventions Self-care/ADL training;Therapeutic exercise;Patient/family education;Ultrasound;Manual Therapy;Therapeutic activities;Cryotherapy;Electrical Stimulation;Moist Heat;Passive range of motion   Plan P: Pt will benefit from skilled OT services to decrease pain and fascial restrictions, increase joint range of motion and strength, improving functional use of LUE as assist during daily tasks. Treatment plan: myofascial release, manual therapy, P/ROM, AA/ROM, A/ROM, general LUE strengthening, scapular stability and strengthening, modalities as needed   OT Home Exercise Plan 2023-05-14: table slides   Consulted and Agree with Plan of Care Patient;Family member/caregiver   Family Member Consulted Abigail Butts      Patient will benefit from skilled therapeutic intervention in order to improve the following deficits and impairments:  Decreased strength, Impaired flexibility, Decreased range of motion, Pain, Impaired UE functional use, Increased fascial restricitons  Visit Diagnosis: Acute pain of left shoulder  Stiffness of left shoulder, not elsewhere classified  Other symptoms and signs involving the musculoskeletal system      G-Codes - May 13, 2017 1211    Functional Assessment Tool Used (Outpatient only) Clinical judgement (FOTO not completed due to cognitive status)   Functional Limitation Carrying, moving and handling objects   Carrying, Moving and Handling Objects Current Status (M0802) At least 60 percent but less than 80 percent impaired, limited or restricted   Carrying, Moving and Handling Objects Goal Status (M3361) At least 20 percent but less than 40 percent impaired, limited or restricted       Problem List There are no active problems to display for this patient.  Guadelupe Sabin, OTR/L  [phone] May 13, 2017, 12:12 PM  [place] [address] [place], [place], [address] Phone: [phone]   Fax:  [phone]  Name: Marisa Lawrence MRN: [id number] Date of Birth: 16-Oct-1928

## 2017-04-22 NOTE — Patient Instructions (Signed)
SHOULDER: Flexion On Table   Place hands on table, elbows straight. Move hips away from body. Press hands down into table.  _10-15__ reps per set, _2-3__ sets per day  Abduction (Passive)   With arm out to side, resting on table, lower head toward arm, keeping trunk away from table.  Repeat __10-15__ times. Do _2-3___ sessions per day.  Copyright  VHI. All rights reserved.     Internal Rotation (Assistive)   Seated with elbow bent at right angle and held against side, slide arm on table surface in an inward arc. Repeat _10-15___ times. Do _2-3___ sessions per day. Activity: Use this motion to brush crumbs off the table.  Copyright  VHI. All rights reserved.    

## 2017-04-22 NOTE — Addendum Note (Signed)
Addended by: Guadelupe Sabin A on: 04/22/2017 12:13 PM   Modules accepted: Orders

## 2017-04-26 ENCOUNTER — Ambulatory Visit (HOSPITAL_COMMUNITY): Payer: Medicare Other

## 2017-04-26 DIAGNOSIS — M25612 Stiffness of left shoulder, not elsewhere classified: Secondary | ICD-10-CM

## 2017-04-26 DIAGNOSIS — R29898 Other symptoms and signs involving the musculoskeletal system: Secondary | ICD-10-CM | POA: Diagnosis not present

## 2017-04-26 DIAGNOSIS — M25512 Pain in left shoulder: Secondary | ICD-10-CM | POA: Diagnosis not present

## 2017-04-27 NOTE — Therapy (Addendum)
[place], [place], [address] Phone: [phone]   Fax:  [phone]  Occupational Therapy Treatment  Patient Details  Name: Marisa Lawrence MRN: [id number] Date of Birth: 09/30/1928 Referring Provider: Dr. Arther Abbott  Encounter Date: 04/26/2017      OT End of Session - 04/27/17 1056    Visit Number 2   Number of Visits 16   Date for OT Re-Evaluation 06/21/17  mini-reassessment 05/21/2017   Authorization Type 1) Medicare part A & B 2) [employer]   Authorization Time Period Before 10th visit   Authorization - Visit Number 2   Authorization - Number of Visits 10   Activity Tolerance Patient tolerated treatment well   Behavior During Therapy [facility] for tasks assessed/performed      Past Medical History:  Diagnosis Date  . Acid reflux   . Dementia   . Hyperlipidemia     No past surgical history on file.  There were no vitals filed for this visit.      Subjective Assessment - 04/27/17 1053    Subjective  S: Ouch.    Patient is accompained by: Family member  daughter   Currently in Pain? No/denies            [facility] OT Assessment - 04/26/17 1501      Assessment   Diagnosis Left proximal humerus fracture     Precautions   Precautions None                  OT Treatments/Exercises (OP) - 04/26/17 1501      Exercises   Exercises Shoulder     Shoulder Exercises: Supine   Protraction PROM;10 reps;AAROM;5 reps   Horizontal ABduction PROM;10 reps;AAROM;5 reps   External Rotation PROM;AAROM;10 reps   Internal Rotation PROM;AAROM;10 reps   Flexion PROM;10 reps;AAROM;5 reps   ABduction PROM;10 reps;AAROM;5 reps     Shoulder Exercises: Pulleys   Flexion 1 minute   ABduction 1 minute     Shoulder Exercises: Therapy Ball   Flexion 10 reps   ABduction 10 reps     Shoulder Exercises: ROM/Strengthening   Wall Wash 1'  with therapist assist     Manual Therapy   Manual Therapy  Myofascial release   Manual therapy comments Manual therapy completed prior to exercises.   Myofascial Release Myofascial release and manual stretching completed to Left upper arm, trapezius, and scapularis region to decrease fascial restrictions and increase joint mobility in a pain free zone.                OT Education - 04/26/17 1055    Education provided Yes   Education Details Pt was given handout of OT evaluation and reviewed goals and plan of care.   Person(s) Educated Patient;Child(ren)   Methods Explanation;Handout   Comprehension Verbalized understanding          OT Short Term Goals - 04/27/17 1100      OT SHORT TERM GOAL #1   Title Pt will be educated on HEP to improve use of LUE during daily activities.    Time 4   Period Weeks   Status On-going     OT SHORT TERM GOAL #2   Title Pt will decrease LUE fascial restrictions from mod to min amounts to improve mobility required for dressing.    Time 4   Period Weeks   Status On-going     OT SHORT TERM GOAL #3   Title  Pt will decrease LUE pain to 4/10 to improve ability to use LUE as assist during daily tasks.    Time 4   Period Weeks   Status On-going     OT SHORT TERM GOAL #4   Title Pt will increase LUE P/ROM to [facility] to improve ability to use LUE as assist during bathing tasks.    Time 4   Period Weeks   Status On-going     OT SHORT TERM GOAL #5   Title Pt will increase LUE strength to 3+/5 to improve ability to use LUE when pushing up from chairs/bed.    Time 4   Period Weeks   Status On-going           OT Long Term Goals - 04/27/17 1100      OT LONG TERM GOAL #1   Title Pt will return to highest level of functioning and independence using LUE during ADL completion.    Time 8   Period Weeks   Status On-going     OT LONG TERM GOAL #2   Title Pt will decrease LUE fascial restrictions from min to trace amounts to improve mobility required for overhead reaching.    Time 8   Period Weeks    Status On-going     OT LONG TERM GOAL #3   Title Pt will decrease LUE pain to 2/10 to improve ability to use LUE during grooming tasks.    Time 8   Period Weeks   Status On-going     OT LONG TERM GOAL #4   Title Pt will increase LUE A/ROM to [facility] to improve ability to reach into overhead cabinets.    Time 8   Period Weeks   Status On-going     OT LONG TERM GOAL #5   Title Pt will increase LUE strength to 4/5 to improve ability to use LUE to lift lightweight items.    Time 8   Period Weeks   Status On-going               Plan - 04/27/17 1056    Clinical Impression Statement A: Initiated myofascial release, manual stretching, AA/ROM supine, and therapy ball stretches. VC for form and technique with mild physical assist when needed.    Plan P: Continue with AA/ROM exercises supine. Add to HEP when appropriate. Work on increasing ROM needed to complete functional reaching tasks.      Patient will benefit from skilled therapeutic intervention in order to improve the following deficits and impairments:  Decreased strength, Impaired flexibility, Decreased range of motion, Pain, Impaired UE functional use, Increased fascial restricitons  Visit Diagnosis: Stiffness of left shoulder, not elsewhere classified  Acute pain of left shoulder  Other symptoms and signs involving the musculoskeletal system    Problem List There are no active problems to display for this patient.  Ailene Ravel, OTR/L,CBIS  [phone]  04/27/2017, 11:01 AM  [place] [address] [place], [place], [address] Phone: [phone]   Fax:  [phone]  Name: [name] MRN: [id number] Date of Birth: Sep 27, 1928   OCCUPATIONAL THERAPY DISCHARGE SUMMARY  Visits from Start of Care: 2  Current functional level related to goals / functional outcomes: No goals met. Pt has limited use of LUE due to fracture. Pain noted with all activity.    Remaining deficits: Patient continues to have the deficits mentioned above.   Education / Equipment: Table slides. Plan: Patient agrees to discharge.  Patient goals were not met. Patient is being discharged due to the patient's request.  ?????Daughter wishes to have patient discharged due to pain after first treatment.

## 2017-04-28 ENCOUNTER — Telehealth (HOSPITAL_COMMUNITY): Payer: Self-pay | Admitting: Family Medicine

## 2017-04-28 ENCOUNTER — Ambulatory Visit (HOSPITAL_COMMUNITY): Payer: Medicare Other | Admitting: Specialist

## 2017-04-28 NOTE — Telephone Encounter (Signed)
04/28/17  9:19am Daughter Abigail Butts called and wondered if her mom should still come to therapy.  Since she was last here her arm has really been hurting and night before last she didn't sleep because of it.  I put a note into Magda Paganini to call daughter back to decide whether patient needed to keep today's appointment.

## 2017-05-03 ENCOUNTER — Ambulatory Visit (HOSPITAL_COMMUNITY): Payer: Medicare Other | Admitting: Specialist

## 2017-05-03 ENCOUNTER — Telehealth (HOSPITAL_COMMUNITY): Payer: Self-pay | Admitting: Specialist

## 2017-05-03 ENCOUNTER — Telehealth: Payer: Self-pay | Admitting: Orthopedic Surgery

## 2017-05-03 NOTE — Telephone Encounter (Signed)
Therapist called regarding missed OT visit.  Left message with family member reminding patient of next visit. Vangie Bicker, [place], OTR/L [phone]

## 2017-05-03 NOTE — Telephone Encounter (Signed)
THAT IS FINE

## 2017-05-03 NOTE — Telephone Encounter (Signed)
Patient's daughter called saying that her mom doesn't want to go back to therapy. When the patient went last week, her daughter thought that they had worked her mom to hard because the next couple of days her mom was in pain. She stated that she has her mom doing exercises at home and her mom does them without any issues. Her mom does not want to go back to therapy and her daughter was wanting to know what you thought she should do.  Please advise.

## 2017-05-03 NOTE — Telephone Encounter (Signed)
I contacted patient's daughter and told her Dr. Ruthe Mannan reply.

## 2017-05-05 ENCOUNTER — Telehealth (HOSPITAL_COMMUNITY): Payer: Self-pay | Admitting: Family Medicine

## 2017-05-05 ENCOUNTER — Ambulatory Visit (HOSPITAL_COMMUNITY): Payer: Medicare Other | Admitting: Occupational Therapy

## 2017-05-05 NOTE — Telephone Encounter (Signed)
05/05/17 got a messge from patient's daughter that she spoke to Dr. Aline Brochure and he said it would be ok for her to stop coming to therapy

## 2017-05-10 ENCOUNTER — Encounter (HOSPITAL_COMMUNITY): Payer: Medicare Other

## 2017-05-13 ENCOUNTER — Encounter (HOSPITAL_COMMUNITY): Payer: Medicare Other | Admitting: Specialist

## 2017-05-18 ENCOUNTER — Ambulatory Visit (HOSPITAL_COMMUNITY): Payer: Medicare Other | Admitting: Occupational Therapy

## 2017-05-20 ENCOUNTER — Encounter (HOSPITAL_COMMUNITY): Payer: Medicare Other | Admitting: Occupational Therapy

## 2017-05-21 ENCOUNTER — Telehealth: Payer: Self-pay | Admitting: Orthopedic Surgery

## 2017-05-21 NOTE — Telephone Encounter (Signed)
Pt's daughter Abigail Butts called and canceled the appointment for Monday, June 4th.  She said she would have to check their schedule and call us back to reschedule appointment

## 2017-05-24 ENCOUNTER — Ambulatory Visit: Payer: Medicare Other | Admitting: Orthopedic Surgery

## 2017-05-25 ENCOUNTER — Encounter (HOSPITAL_COMMUNITY): Payer: Medicare Other | Admitting: Occupational Therapy

## 2017-05-27 ENCOUNTER — Encounter (HOSPITAL_COMMUNITY): Payer: Medicare Other | Admitting: Occupational Therapy

## 2017-05-31 ENCOUNTER — Encounter (HOSPITAL_COMMUNITY): Payer: Medicare Other

## 2017-06-03 ENCOUNTER — Encounter (HOSPITAL_COMMUNITY): Payer: Medicare Other | Admitting: Occupational Therapy

## 2017-06-07 ENCOUNTER — Encounter (HOSPITAL_COMMUNITY): Payer: Medicare Other | Admitting: Specialist

## 2017-06-10 ENCOUNTER — Encounter (HOSPITAL_COMMUNITY): Payer: Medicare Other | Admitting: Occupational Therapy

## 2017-06-14 ENCOUNTER — Encounter (HOSPITAL_COMMUNITY): Payer: Medicare Other | Admitting: Specialist

## 2017-06-17 ENCOUNTER — Encounter (HOSPITAL_COMMUNITY): Payer: Medicare Other | Admitting: Occupational Therapy

## 2017-11-19 DIAGNOSIS — M545 Low back pain: Secondary | ICD-10-CM | POA: Diagnosis not present

## 2017-11-19 DIAGNOSIS — Z6822 Body mass index (BMI) 22.0-22.9, adult: Secondary | ICD-10-CM | POA: Diagnosis not present

## 2017-11-19 DIAGNOSIS — M810 Age-related osteoporosis without current pathological fracture: Secondary | ICD-10-CM | POA: Diagnosis not present

## 2017-11-19 DIAGNOSIS — M1991 Primary osteoarthritis, unspecified site: Secondary | ICD-10-CM | POA: Diagnosis not present

## 2017-11-19 DIAGNOSIS — Z1389 Encounter for screening for other disorder: Secondary | ICD-10-CM | POA: Diagnosis not present

## 2017-11-19 DIAGNOSIS — M5136 Other intervertebral disc degeneration, lumbar region: Secondary | ICD-10-CM | POA: Diagnosis not present

## 2017-11-29 ENCOUNTER — Emergency Department (HOSPITAL_COMMUNITY)
Admission: EM | Admit: 2017-11-29 | Discharge: 2017-11-29 | Disposition: A | Discharge status: Home / Self Care | Payer: Medicare Other | Attending: Emergency Medicine | Admitting: Emergency Medicine

## 2017-11-29 ENCOUNTER — Emergency Department (HOSPITAL_COMMUNITY): Payer: Medicare Other

## 2017-11-29 ENCOUNTER — Encounter (HOSPITAL_COMMUNITY): Payer: Self-pay | Admitting: Emergency Medicine

## 2017-11-29 DIAGNOSIS — F0390 Unspecified dementia without behavioral disturbance: Secondary | ICD-10-CM | POA: Diagnosis not present

## 2017-11-29 DIAGNOSIS — K529 Noninfective gastroenteritis and colitis, unspecified: Secondary | ICD-10-CM

## 2017-11-29 DIAGNOSIS — K6389 Other specified diseases of intestine: Secondary | ICD-10-CM | POA: Diagnosis not present

## 2017-11-29 DIAGNOSIS — K5289 Other specified noninfective gastroenteritis and colitis: Secondary | ICD-10-CM | POA: Insufficient documentation

## 2017-11-29 DIAGNOSIS — R197 Diarrhea, unspecified: Secondary | ICD-10-CM

## 2017-11-29 DIAGNOSIS — R531 Weakness: Secondary | ICD-10-CM

## 2017-11-29 DIAGNOSIS — Z79899 Other long term (current) drug therapy: Secondary | ICD-10-CM | POA: Insufficient documentation

## 2017-11-29 LAB — CBC WITH DIFFERENTIAL/PLATELET
BASOS ABS: 0 10*3/uL (ref 0.0–0.1)
BASOS PCT: 0 %
Eosinophils Absolute: 0 10*3/uL (ref 0.0–0.7)
Eosinophils Relative: 0 %
HEMATOCRIT: 40.8 % (ref 36.0–46.0)
Hemoglobin: 13.5 g/dL (ref 12.0–15.0)
LYMPHS ABS: 0.7 10*3/uL (ref 0.7–4.0)
LYMPHS PCT: 7 %
MCH: 29.9 pg (ref 26.0–34.0)
MCHC: 33.1 g/dL (ref 30.0–36.0)
MCV: 90.5 fL (ref 78.0–100.0)
Monocytes Absolute: 1 10*3/uL (ref 0.1–1.0)
Monocytes Relative: 11 %
Neutro Abs: 7.9 10*3/uL — ABNORMAL HIGH (ref 1.7–7.7)
Neutrophils Relative %: 82 %
Platelets: 326 10*3/uL (ref 150–400)
RBC: 4.51 MIL/uL (ref 3.87–5.11)
RDW: 12.9 % (ref 11.5–15.5)
WBC: 9.6 10*3/uL (ref 4.0–10.5)

## 2017-11-29 LAB — COMPREHENSIVE METABOLIC PANEL
ALT: 21 U/L (ref 14–54)
AST: 23 U/L (ref 15–41)
Albumin: 3.7 g/dL (ref 3.5–5.0)
Alkaline Phosphatase: 67 U/L (ref 38–126)
Anion gap: 10 (ref 5–15)
BILIRUBIN TOTAL: 2 mg/dL — AB (ref 0.3–1.2)
BUN: 17 mg/dL (ref 6–20)
CO2: 25 mmol/L (ref 22–32)
CREATININE: 0.86 mg/dL (ref 0.44–1.00)
Calcium: 8.7 mg/dL — ABNORMAL LOW (ref 8.9–10.3)
Chloride: 102 mmol/L (ref 101–111)
GFR calc Af Amer: >60 mL/min (ref >60)
GFR, EST NON AFRICAN AMERICAN: 59 mL/min — AB (ref >60)
Glucose, Bld: 157 mg/dL — ABNORMAL HIGH (ref 65–99)
POTASSIUM: 2.8 mmol/L — AB (ref 3.5–5.1)
Sodium: 137 mmol/L (ref 135–145)
TOTAL PROTEIN: 6.9 g/dL (ref 6.5–8.1)

## 2017-11-29 LAB — URINALYSIS, ROUTINE W REFLEX MICROSCOPIC
Bilirubin Urine: NEGATIVE
GLUCOSE, UA: NEGATIVE mg/dL
Hgb urine dipstick: NEGATIVE
Ketones, ur: NEGATIVE mg/dL
LEUKOCYTES UA: NEGATIVE
NITRITE: NEGATIVE
PH: 5 (ref 5.0–8.0)
PROTEIN: NEGATIVE mg/dL
Specific Gravity, Urine: 1.017 (ref 1.005–1.030)

## 2017-11-29 LAB — TROPONIN I: Troponin I: <0.03 ng/mL (ref <0.03)

## 2017-11-29 LAB — LACTIC ACID, PLASMA
Lactic Acid, Venous: 1.2 mmol/L (ref 0.5–1.9)
Lactic Acid, Venous: 1 mmol/L (ref 0.5–1.9)

## 2017-11-29 LAB — POC OCCULT BLOOD, ED: Fecal Occult Bld: POSITIVE — AB

## 2017-11-29 LAB — LIPASE, BLOOD: Lipase: 21 U/L (ref 11–51)

## 2017-11-29 MED ORDER — SODIUM CHLORIDE 0.9 % IV BOLUS (SEPSIS)
250.0000 mL | Freq: Once | INTRAVENOUS | Status: AC
  Administered 2017-11-29: 250 mL via INTRAVENOUS

## 2017-11-29 MED ORDER — IOPAMIDOL (ISOVUE-300) INJECTION 61%
100.0000 mL | Freq: Once | INTRAVENOUS | Status: AC | PRN
  Administered 2017-11-29: 100 mL via INTRAVENOUS

## 2017-11-29 MED ORDER — IOPAMIDOL (ISOVUE-300) INJECTION 61%
INTRAVENOUS | Status: AC
  Filled 2017-11-29: qty 30

## 2017-11-29 MED ORDER — CIPROFLOXACIN IN D5W 400 MG/200ML IV SOLN
400.0000 mg | Freq: Once | INTRAVENOUS | Status: AC
  Administered 2017-11-29: 400 mg via INTRAVENOUS
  Filled 2017-11-29: qty 200

## 2017-11-29 MED ORDER — METRONIDAZOLE IN NACL 5-0.79 MG/ML-% IV SOLN
500.0000 mg | Freq: Once | INTRAVENOUS | Status: AC
  Administered 2017-11-29: 500 mg via INTRAVENOUS
  Filled 2017-11-29: qty 100

## 2017-11-29 MED ORDER — METRONIDAZOLE 500 MG PO TABS: 500.0000 mg | ORAL_TABLET | Freq: Two times a day (BID) | ORAL | 0 refills | Status: AC

## 2017-11-29 MED ORDER — SODIUM CHLORIDE 0.9 % IV SOLN
INTRAVENOUS | Status: DC
  Administered 2017-11-29: 13:00:00 via INTRAVENOUS

## 2017-11-29 MED ORDER — CIPROFLOXACIN HCL 500 MG PO TABS: 500.0000 mg | ORAL_TABLET | Freq: Two times a day (BID) | ORAL | 0 refills | Status: AC

## 2017-11-29 MED ORDER — POTASSIUM CHLORIDE CRYS ER 20 MEQ PO TBCR
40.0000 meq | EXTENDED_RELEASE_TABLET | Freq: Once | ORAL | Status: AC
  Administered 2017-11-29: 40 meq via ORAL
  Filled 2017-11-29: qty 2

## 2017-11-29 NOTE — ED Notes (Signed)
Pt has not had BM at this time, unable to collect stool sample.

## 2017-11-29 NOTE — Discharge Instructions (Signed)
Take the prescriptions as directed.  Increase your fluid intake (ie:  Gatoraide) for the next few days.  Eat a bland diet and advance to your regular diet slowly as you can tolerate it.   Avoid full strength juices, as well as milk and milk products until your diarrhea has resolved.   Call your regular medical doctor tomorrow to schedule a follow up appointment in the next 2 days. Call your GI doctor tomorrow to schedule a follow up appointment within the week.  Return to the Emergency Department immediately if not improving (or even worsening) despite taking the medicines as prescribed, any black or bloody stool or vomit, if you develop a fever over "101," or for any other concerns.

## 2017-11-29 NOTE — ED Provider Notes (Signed)
[facility] EMERGENCY DEPARTMENT Provider Note   CSN: [id number] Arrival date & time: 11/29/17  1142     History   Chief Complaint Chief Complaint  Patient presents with  . Diarrhea    HPI Marisa Lawrence is a 83 y.o. female.  The history is provided by a relative and a caregiver. The history is limited by the condition of the patient (Hx dementia).  Diarrhea    Pt was seen at 1220. Per pt's family: Pt with multiple intermittent episodes of diarrhea for the past 1 week, slowly improved over the past 3 days. Has been associated with generalized weakness, generalized abd pain and decreased PO intake for the past 3 days. Family describes the diarrhea as "multiple" and "really runny" over the past week, but now "just loose" and "only 2 or 3 per day." Denies fevers, no CP/SOB, no black or blood in stools, no vomiting.    Past Medical History:  Diagnosis Date  . Acid reflux   . Dementia   . Hyperlipidemia     There are no active problems to display for this patient.   Past Surgical History:  Procedure Laterality Date  . ABDOMINAL HYSTERECTOMY      OB History    Gravida Para Term Preterm AB Living             4   SAB TAB Ectopic Multiple Live Births                   Home Medications    Prior to Admission medications   Medication Sig Start Date End Date Taking? Authorizing Provider  Cholecalciferol (VITAMIN D PO) Take 1 tablet by mouth daily.   Yes [provider]  donepezil (ARICEPT) 5 MG tablet Take 1 tablet by mouth daily. 01/31/17  Yes [provider]  pantoprazole (PROTONIX) 40 MG tablet Take 40 mg by mouth daily. 10/05/17  Yes [provider]    Family History Family History  Problem Relation Age of Onset  . Cancer Sister     Social History Social History   Tobacco Use  . Smoking status: Never Smoker  . Smokeless tobacco: Never Used  Substance Use Topics  . Alcohol use: No  . Drug use: No     Allergies    Codeine   Review of Systems Review of Systems  Unable to perform ROS: Dementia  Gastrointestinal: Positive for diarrhea.     Physical Exam Updated Vital Signs BP 133/66   Pulse 97   Temp 98.7 F (37.1 C) (Oral)   Resp (!) 24   Ht 5\' 3"  (1.6 m)   Wt 49.9 kg (110 lb)   SpO2 97%   BMI 19.49 kg/m    BP 114/66   Pulse 74   Temp 98.7 F (37.1 C) (Oral)   Resp 15   Ht 5\' 3"  (1.6 m)   Wt 49.9 kg (110 lb)   SpO2 95%   BMI 19.49 kg/m    12:53 Orthostatic Vital Signs VB  Orthostatic Lying   BP- Lying: 126/64  Pulse- Lying: 73      Orthostatic Sitting  BP- Sitting: 143/82  Pulse- Sitting: 74      Orthostatic Standing at 0 minutes  BP- Standing at 0 minutes: 123/64  Pulse- Standing at 0 minutes: 83     Physical Exam 1225: Physical examination:  Nursing notes reviewed; Vital signs and O2 SAT reviewed;  Constitutional: Well developed, Well nourished, In no acute distress; Head:  Normocephalic, atraumatic; Eyes: EOMI, PERRL, No scleral icterus; ENMT: Mouth and pharynx normal, Mucous membranes dry; Neck: Supple, Full range of motion, No lymphadenopathy; Cardiovascular: Regular rate and rhythm, No gallop; Respiratory: Breath sounds clear & equal bilaterally, No wheezes.  Normal respiratory effort/excursion; Chest: Nontender, Movement normal; Abdomen: Soft, +diffuse tenderness to palp. No rebound or guarding. Nondistended, Normal bowel sounds; Genitourinary: No CVA tenderness; Extremities: Pulses normal, No tenderness, No edema, No calf edema or asymmetry.; Neuro: Awake, alert, confused per hx dementia. No facial droop. Speech minimal but clear. Moves all extremities spontaneously on stretcher without apparent gross focal motor deficits.; Skin: Color normal, Warm, Dry.   ED Treatments / Results  Labs (all labs ordered are listed, but only abnormal results are displayed)   EKG  EKG Interpretation  Date/Time:  Monday November 29 2017 12:43:42 EST Ventricular Rate:   75 PR Interval:    QRS Duration: 82 QT Interval:  393 QTC Calculation: 439 R Axis:   -18 Text Interpretation:  Sinus rhythm Supraventricular bigeminy Borderline left axis deviation Low voltage, extremity leads Nonspecific T abnormalities, lateral leads Baseline wander When compared with ECG of 06/16/2008 No significant change was found Confirmed by Francine Graven [phone]) on 11/29/2017 1:25:44 PM       Radiology   Procedures Procedures (including critical care time)  Medications Ordered in ED Medications  0.9 %  sodium chloride infusion ( Intravenous New Bag/Given 11/29/17 1246)  iopamidol (ISOVUE-300) 61 % injection (not administered)     Initial Impression / Assessment and Plan / ED Course  I have reviewed the triage vital signs and the nursing notes.  Pertinent labs & imaging results that were available during my care of the patient were reviewed by me and considered in my medical decision making (see chart for details).  MDM Reviewed: previous chart, vitals and nursing note Reviewed previous: labs Interpretation: labs, x-ray and CT scan    Results for orders placed or performed during the hospital encounter of 11/29/17  Comprehensive metabolic panel  Result Value Ref Range   Sodium 137 135 - 145 mmol/L   Potassium 2.8 (L) 3.5 - 5.1 mmol/L   Chloride 102 101 - 111 mmol/L   CO2 25 22 - 32 mmol/L   Glucose, Bld 157 (H) 65 - 99 mg/dL   BUN 17 6 - 20 mg/dL   Creatinine, Ser 0.86 0.44 - 1.00 mg/dL   Calcium 8.7 (L) 8.9 - 10.3 mg/dL   Total Protein 6.9 6.5 - 8.1 g/dL   Albumin 3.7 3.5 - 5.0 g/dL   AST 23 15 - 41 U/L   ALT 21 14 - 54 U/L   Alkaline Phosphatase 67 38 - 126 U/L   Total Bilirubin 2.0 (H) 0.3 - 1.2 mg/dL   GFR calc non Af Amer 59 (L) >60 mL/min   GFR calc Af Amer >60 >60 mL/min   Anion gap 10 5 - 15  Troponin I  Result Value Ref Range   Troponin I <0.03 <0.03 ng/mL  Lactic acid, plasma  Result Value Ref Range   Lactic Acid, Venous 1.2 0.5 - 1.9  mmol/L  Lactic acid, plasma  Result Value Ref Range   Lactic Acid, Venous 1.0 0.5 - 1.9 mmol/L  Lipase, blood  Result Value Ref Range   Lipase 21 11 - 51 U/L  CBC with Differential  Result Value Ref Range   WBC 9.6 4.0 - 10.5 K/uL   RBC 4.51 3.87 - 5.11 MIL/uL   Hemoglobin 13.5 12.0 - 15.0  g/dL   HCT 40.8 36.0 - 46.0 %   MCV 90.5 78.0 - 100.0 fL   MCH 29.9 26.0 - 34.0 pg   MCHC 33.1 30.0 - 36.0 g/dL   RDW 12.9 11.5 - 15.5 %   Platelets 326 150 - 400 K/uL   Neutrophils Relative % 82 %   Neutro Abs 7.9 (H) 1.7 - 7.7 K/uL   Lymphocytes Relative 7 %   Lymphs Abs 0.7 0.7 - 4.0 K/uL   Monocytes Relative 11 %   Monocytes Absolute 1.0 0.1 - 1.0 K/uL   Eosinophils Relative 0 %   Eosinophils Absolute 0.0 0.0 - 0.7 K/uL   Basophils Relative 0 %   Basophils Absolute 0.0 0.0 - 0.1 K/uL  Urinalysis, Routine w reflex microscopic  Result Value Ref Range   Color, Urine AMBER (A) YELLOW   APPearance CLEAR CLEAR   Specific Gravity, Urine 1.017 1.005 - 1.030   pH 5.0 5.0 - 8.0   Glucose, UA NEGATIVE NEGATIVE mg/dL   Hgb urine dipstick NEGATIVE NEGATIVE   Bilirubin Urine NEGATIVE NEGATIVE   Ketones, ur NEGATIVE NEGATIVE mg/dL   Protein, ur NEGATIVE NEGATIVE mg/dL   Nitrite NEGATIVE NEGATIVE   Leukocytes, UA NEGATIVE NEGATIVE  POC occult blood, ED  Result Value Ref Range   Fecal Occult Bld POSITIVE (A) NEGATIVE    Dg Chest 2 View Result Date: 11/29/2017 CLINICAL DATA:  Diarrhea and lack of appetite. EXAM: CHEST  2 VIEW COMPARISON:  01/25/2014 FINDINGS: The heart size is normal. Calcified plaque again noted in the thoracic aorta. There is a hiatal hernia. There is no evidence of pulmonary edema, consolidation, pneumothorax, nodule or pleural fluid. The visualized skeletal structures are unremarkable. IMPRESSION: No active cardiopulmonary disease.  Evidence of hiatal hernia. Electronically Signed   By: Aletta Edouard M.D.   On: 11/29/2017 13:35   Ct Abdomen Pelvis W Contrast Result Date:  11/29/2017 CLINICAL DATA:  Patient with diarrhea.  No appetite. EXAM: CT ABDOMEN AND PELVIS WITH CONTRAST TECHNIQUE: Multidetector CT imaging of the abdomen and pelvis was performed using the standard protocol following bolus administration of intravenous contrast. CONTRAST:  18mL ISOVUE-300 IOPAMIDOL (ISOVUE-300) INJECTION 61% COMPARISON:  Abdominal radiograph 08/02/2010 FINDINGS: Lower chest: Heart is mildly enlarged. Moderate size hiatal hernia. Dependent atelectasis within the bilateral lower lobes. Hepatobiliary: Liver is normal in size and contour. Multiple calcified stones within the gallbladder lumen. No gallbladder wall thickening or pericholecystic fluid. No intrahepatic or extrahepatic biliary ductal dilatation. Pancreas: Mildly atrophic. There is a 7 mm cystic lesion within the pancreatic body (image 19; series 2). Spleen: Unremarkable Adrenals/Urinary Tract: Mild thickening of the adrenal glands. Kidneys enhance symmetrically with contrast. There is a 3.9 cm cyst within the superior pole of the right kidney (image 18; series 2). Stomach/Bowel: Descending and sigmoid colonic diverticulosis. No CT evidence for acute diverticulitis. The transverse colon is decompressed however there is suggestion of wall thickening. Normal appearance of the appendix. Vascular/Lymphatic: Normal caliber abdominal aorta. Peripheral calcified atherosclerotic plaque. No retroperitoneal lymphadenopathy. Reproductive: Unremarkable. Other: None. Musculoskeletal: Chronic compression deformity of the L1 vertebral body. This was demonstrated on radiograph 08/02/2010. Age-indeterminate mild compression deformity of the L3 vertebral body. IMPRESSION: 1. There is wall thickening of the transverse colon raising the possibility of colitis. 2. Age-indeterminate mild compression deformity of the L3 vertebral body. Recommend correlation for point tenderness. 3. Cholelithiasis. No secondary CT signs to suggest acute cholecystitis. 4. There  is a 7 mm pancreatic cystic lesion. Recommend follow up pre and post  contrast MRI/MRCP or pancreatic protocol CT in 2 years. This recommendation follows ACR consensus guidelines: Management of Incidental Pancreatic Cysts: A White Paper of the ACR Incidental Findings Committee. [place] [id number];[id number]. Electronically Signed   By: Lovey Newcomer M.D.   On: 11/29/2017 15:37    1630:   Potassium repleted PO. Possibility of colitis on CT scan; IV abx started. Stool is heme positive, but H/H normal. Pt not orthostatic during VS.  Pt has ambulated with assist per her baseline gait, per family watching pt walk. I offered admission. Family refuses and would like to take pt home. Family requests "some more IV fluid faster because she looks better with it;" ED RN and I explained rationale behind judicious IVF dosing. Pt's family verb understanding. Will give small IVF bolus per family request. Pt requesting "something to eat."   0272: Pt has tol PO well while in the ED without N/V.  No stooling while in the ED.  Abd remains benign, VSS.  Ambulated with baseline gait. Offered admission again; family refuses, continues to want to take pt home. Strict return precautions given. Dx and testing d/w pt's family.  Questions answered.  Verb understanding, agreeable to d/c home with outpt f/u.     Final Clinical Impressions(s) / ED Diagnoses   Final diagnoses:  None    ED Discharge Orders    None        Francine Graven, DO 12/03/17 1615

## 2017-11-29 NOTE — ED Notes (Signed)
Pt to CT

## 2017-11-29 NOTE — ED Triage Notes (Signed)
Patient's family states patient has had diarrhea and no appetite x 3 days. Patient denies pain at triage.

## 2017-11-29 NOTE — ED Notes (Signed)
Pt returned from xray at this time.

## 2017-11-29 NOTE — ED Notes (Signed)
Pt tolerating PO fluids. Able to ambulate around nurse's station with this RN and tech at side with no problem.

## 2017-11-29 NOTE — ED Notes (Signed)
Pt returned from CT at this time.  

## 2017-12-01 ENCOUNTER — Telehealth: Payer: Self-pay | Admitting: Gastroenterology

## 2017-12-01 ENCOUNTER — Other Ambulatory Visit: Payer: Self-pay

## 2017-12-01 LAB — URINE CULTURE: CULTURE: NO GROWTH

## 2017-12-01 NOTE — Telephone Encounter (Signed)
Pt was seen in ER recently and was advised to follow up with [place]. Can we accept her as a new patient? Her daughter, Abigail Butts, is her caregiver and asked for me to call her at [phone]

## 2017-12-01 NOTE — Telephone Encounter (Signed)
Yes

## 2017-12-01 NOTE — Telephone Encounter (Signed)
Daugher is aware of OV with [facility]SL tomorrow

## 2017-12-01 NOTE — Patient Outreach (Signed)
[facility]) Care Management  12/01/2017  Marisa Lawrence Nov 26, 1928 [id number]   Telephone Screen  Referral Date: 11/30/17 Referral Source: ED Census Report Referral Reason:" 1 or more Ed visits within the past 6 months" Insurance: Medicare    Outreach attempt # 1 to patient. Spoke with dtr due to patient's documented history of dementia. Dtr states that patient is doing okay. She still is weak but is up walking and moving around. Patient went to the ED on 11/29/17 for diarrhea and weakness. Dtr reports that diarrhea has resolved. Patient has not had a BM since return home. Educated dtr on importance of regular BM q3 days and provided some nonpharmacologic measures to help patient have BM. Dtr voices they have stool softeners in the home for prn usage as well. She voices that patient does not have much of an appetite and eats whatever she wants. RN CM encouraged dtr to increase patient's fiber intake. She is aware of when to seek medical attention for changes in conditions. Patient's dtrs are providing a rotating shift to take care of patient. She denies any needs with meds, transportation or anything else. Dtr appreciative of f/u call.      Plan: RN CM will notify [facility] administrative assistant of case status.   Enzo Montgomery, RN,BSN,CCM [place] [facility] Telephonic Care Management Coordinator Direct Phone: [phone] Toll Free: [phone] Fax: [phone]

## 2017-12-02 ENCOUNTER — Encounter: Payer: Self-pay | Admitting: Gastroenterology

## 2017-12-02 ENCOUNTER — Ambulatory Visit (INDEPENDENT_AMBULATORY_CARE_PROVIDER_SITE_OTHER): Payer: Medicare Other | Admitting: Gastroenterology

## 2017-12-02 VITALS — BP 148/76 | HR 74 | Temp 97.0°F | Ht 63.0 in | Wt 113.6 lb

## 2017-12-02 DIAGNOSIS — R0789 Other chest pain: Secondary | ICD-10-CM | POA: Insufficient documentation

## 2017-12-02 DIAGNOSIS — K529 Noninfective gastroenteritis and colitis, unspecified: Secondary | ICD-10-CM

## 2017-12-02 DIAGNOSIS — K5900 Constipation, unspecified: Secondary | ICD-10-CM

## 2017-12-02 DIAGNOSIS — K862 Cyst of pancreas: Secondary | ICD-10-CM | POA: Insufficient documentation

## 2017-12-02 NOTE — Assessment & Plan Note (Signed)
Patient's daughter requesting imaging for rib fracture given recent fall. Doubt complicated rib fracture but will rule out.

## 2017-12-02 NOTE — Assessment & Plan Note (Signed)
Recent diarrhea, diminished appetite, with CT findings suggesting colitis involving transverse colon. Heme + stool. No recent antibiotics. She has improved with resolution of diarrhea. Appetite improve. On cipro/flagyl.  At baseline with intermittent diarrhea apparently.  She has had no prior colonoscopy.  Briefly discussed possibility of colonoscopy with daughter to evaluate CT findings, exclude underlying malignancy.  Right now she wants to monitor her symptoms but if she does not improve may consider discussing colonoscopy with her sisters.  We will get Dr. Roseanne Kaufman opinion as well.  She will complete Cipro and Flagyl.  She is now having constipation, add MiraLAX 17 g daily as needed.  Samples provided.

## 2017-12-02 NOTE — Progress Notes (Signed)
Primary Care Physician:  Sharilyn Sites, MD  Primary Gastroenterologist:  Garfield Cornea, MD   Chief Complaint  Patient presents with  . Constipation    x 3 days  . Abdominal Pain    left side  . Anorexia    HPI:  Marisa Lawrence is a 83 y.o. female here for further evaluation of left-sided abdominal pain, recent diarrhea.  Patient was seen in the ED on December 10 for 1 week history of diarrhea which had been improving at time of her ED evaluation.  She had had diminished oral intake was the reason for going to the ED.  No stooling while in the ED.  She did have a CT abdomen and pelvis showing transverse colon decompressed however suggestion of wall thickening.  Raising question for colitis.  She also had cholelithiasis, 7 mm pancreatic cystic lesion recommending MRI/MRCP or pancreatic protocol CT 2 years. Sent home after treatment of dehydration, hypokalemia and provided with course of cipro and flagyl.   No BM since 11/29/17. Patient felt out of the bed on 11/30/17 and since then has complained of pain in the left upper abdomen at left ribs. Hurts to move and take a deep breath. No fever. No SOB. No vomiting. Appetite slowing improving. Forces the liquids. At baseline patient goes through stages of diarrhea.  She has been doing this for some time.  Denies any recent antibiotic use.  In the ED she was heme positive.  No melena or rectal bleeding.  At her worst she was having 3-4 loose/runny stools but typically has normally 1 bowel movement per day.  Since she fell out of bed she has been getting Tylenol in the evenings to help her sleep.  Sleeps through the night.  X  Discussed pancreatic cyst with daughter today.  Would consider MRI imaging but due to patient's dementia and inability to follow directions and likelihood that she would not be still, will likely pursue CT pancreatic protocol for follow-up imaging.  To discuss with Dr. Gala Romney as well to get his opinion.  No prior TCS.   Current  Outpatient Medications  Medication Sig Dispense Refill  . Cholecalciferol (VITAMIN D PO) Take 1 tablet by mouth daily.    . ciprofloxacin (CIPRO) 500 MG tablet Take 1 tablet (500 mg total) by mouth 2 (two) times daily. 14 tablet 0  . donepezil (ARICEPT) 5 MG tablet Take 1 tablet by mouth daily.    . metroNIDAZOLE (FLAGYL) 500 MG tablet Take 1 tablet (500 mg total) by mouth 2 (two) times daily. 14 tablet 0  . pantoprazole (PROTONIX) 40 MG tablet Take 40 mg by mouth daily.  0   No current facility-administered medications for this visit.     Allergies as of 12/02/2017 - Review Complete 12/02/2017  Allergen Reaction Noted  . Codeine  11/29/2017    Past Medical History:  Diagnosis Date  . Acid reflux   . Dementia   . Hyperlipidemia     Past Surgical History:  Procedure Laterality Date  . ABDOMINAL HYSTERECTOMY      Family History  Problem Relation Age of Onset  . Cancer Sister     Social History   Socioeconomic History  . Marital status: Widowed    Spouse name: Not on file  . Number of children: Not on file  . Years of education: Not on file  . Highest education level: Not on file  Social Needs  . Financial resource strain: Not on file  . Food insecurity -  worry: Not on file  . Food insecurity - inability: Not on file  . Transportation needs - medical: Not on file  . Transportation needs - non-medical: Not on file  Occupational History  . Not on file  Tobacco Use  . Smoking status: Never Smoker  . Smokeless tobacco: Never Used  Substance and Sexual Activity  . Alcohol use: No  . Drug use: No  . Sexual activity: Not on file  Other Topics Concern  . Not on file  Social History Narrative  . Not on file      ROS:  General: Negative for anorexia, weight loss, fever, chills, fatigue, weakness. Eyes: Negative for vision changes.  ENT: Negative for hoarseness, difficulty swallowing , nasal congestion. CV: Negative for chest pain, angina, palpitations, dyspnea  on exertion, peripheral edema.  Respiratory: Negative for dyspnea at rest, dyspnea on exertion, cough, sputum, wheezing.  GI: See history of present illness. GU:  Negative for dysuria, hematuria, urinary incontinence, urinary frequency, nocturnal urination.  MS:see hpi Derm: Negative for rash or itching.  Neuro: Negative for weakness, abnormal sensation, seizure, frequent headaches, memory loss, confusion.  Psych: Negative for anxiety, depression, suicidal ideation, hallucinations.  Endo: Negative for unusual weight change.  Heme: Negative for bruising or bleeding. Allergy: Negative for rash or hives.    Physical Examination:  BP (!) 148/76   Pulse 74   Temp (!) 97 F (36.1 C) (Oral)   Ht 5\' 3"  (1.6 m)   Wt 113 lb 9.6 oz (51.5 kg)   BMI 20.12 kg/m    General: Well-nourished, well-developed in no acute distress. Pleasant. Provides limited history. Follows simple commands. Accompanied by daughter.  Head: Normocephalic, atraumatic.   Eyes: Conjunctiva pink, no icterus. Mouth: Oropharyngeal mucosa moist and pink , no lesions erythema or exudate. Neck: Supple without thyromegaly, masses, or lymphadenopathy.  Lungs: Clear to auscultation bilaterally. Groans with deep inspiration Heart: Regular rate and rhythm, no murmurs rubs or gallops.  Abdomen: Bowel sounds are normal, nontender, nondistended, no hepatosplenomegaly or masses, no abdominal bruits or    hernia , no rebound or guarding.  Minimal tenderness at rib cage margin in MCL.  Rectal: not performed Extremities: No lower extremity edema. No clubbing or deformities.  Neuro: Alert and oriented x 4 , grossly normal neurologically.  Skin: Warm and dry, no rash or jaundice.   Psych: Alert and cooperative, normal mood and affect.  Labs: Lab Results  Component Value Date   WBC 9.6 11/29/2017   HGB 13.5 11/29/2017   HCT 40.8 11/29/2017   MCV 90.5 11/29/2017   PLT 326 11/29/2017   Lab Results  Component Value Date   CREATININE  0.86 11/29/2017   BUN 17 11/29/2017   NA 137 11/29/2017   K 2.8 (L) 11/29/2017   CL 102 11/29/2017   CO2 25 11/29/2017   Lab Results  Component Value Date   ALT 21 11/29/2017   AST 23 11/29/2017   ALKPHOS 67 11/29/2017   BILITOT 2.0 (H) 11/29/2017   Lab Results  Component Value Date   LIPASE 21 11/29/2017     Imaging Studies: Dg Chest 2 View  Result Date: 11/29/2017 CLINICAL DATA:  Diarrhea and lack of appetite. EXAM: CHEST  2 VIEW COMPARISON:  01/25/2014 FINDINGS: The heart size is normal. Calcified plaque again noted in the thoracic aorta. There is a hiatal hernia. There is no evidence of pulmonary edema, consolidation, pneumothorax, nodule or pleural fluid. The visualized skeletal structures are unremarkable. IMPRESSION: No active cardiopulmonary disease.  Evidence of hiatal hernia. Electronically Signed   By: Aletta Edouard M.D.   On: 11/29/2017 13:35   Ct Abdomen Pelvis W Contrast  Result Date: 11/29/2017 CLINICAL DATA:  Patient with diarrhea.  No appetite. EXAM: CT ABDOMEN AND PELVIS WITH CONTRAST TECHNIQUE: Multidetector CT imaging of the abdomen and pelvis was performed using the standard protocol following bolus administration of intravenous contrast. CONTRAST:  155mL ISOVUE-300 IOPAMIDOL (ISOVUE-300) INJECTION 61% COMPARISON:  Abdominal radiograph 08/02/2010 FINDINGS: Lower chest: Heart is mildly enlarged. Moderate size hiatal hernia. Dependent atelectasis within the bilateral lower lobes. Hepatobiliary: Liver is normal in size and contour. Multiple calcified stones within the gallbladder lumen. No gallbladder wall thickening or pericholecystic fluid. No intrahepatic or extrahepatic biliary ductal dilatation. Pancreas: Mildly atrophic. There is a 7 mm cystic lesion within the pancreatic body (image 19; series 2). Spleen: Unremarkable Adrenals/Urinary Tract: Mild thickening of the adrenal glands. Kidneys enhance symmetrically with contrast. There is a 3.9 cm cyst within the  superior pole of the right kidney (image 18; series 2). Stomach/Bowel: Descending and sigmoid colonic diverticulosis. No CT evidence for acute diverticulitis. The transverse colon is decompressed however there is suggestion of wall thickening. Normal appearance of the appendix. Vascular/Lymphatic: Normal caliber abdominal aorta. Peripheral calcified atherosclerotic plaque. No retroperitoneal lymphadenopathy. Reproductive: Unremarkable. Other: None. Musculoskeletal: Chronic compression deformity of the L1 vertebral body. This was demonstrated on radiograph 08/02/2010. Age-indeterminate mild compression deformity of the L3 vertebral body. IMPRESSION: 1. There is wall thickening of the transverse colon raising the possibility of colitis. 2. Age-indeterminate mild compression deformity of the L3 vertebral body. Recommend correlation for point tenderness. 3. Cholelithiasis. No secondary CT signs to suggest acute cholecystitis. 4. There is a 7 mm pancreatic cystic lesion. Recommend follow up pre and post contrast MRI/MRCP or pancreatic protocol CT in 2 years. This recommendation follows ACR consensus guidelines: Management of Incidental Pancreatic Cysts: A White Paper of the ACR Incidental Findings Committee. [place] 3903;[id number]. Electronically Signed   By: Lovey Newcomer M.D.   On: 11/29/2017 15:37

## 2017-12-02 NOTE — Assessment & Plan Note (Signed)
7 mm cystic lesion in the pancreatic body seen on CT.  Consider reevaluation with pancreatic protocol CT in the future, patient likely would not tolerate MRI nor have good imaging due to her dementia.  Discussed timeline with Dr. Gala Romney.

## 2017-12-02 NOTE — Patient Instructions (Signed)
1. Please go for xrays of ribs. 2. Complete cipro and flagyl.  3. Miralax one packet once daily as needed for constipation.  4. Return to the office in 4 weeks or call sooner if needed.    Food Choices to Help Relieve Diarrhea, Adult When you have diarrhea, the foods you eat and your eating habits are very important. Choosing the right foods and drinks can help:  Relieve diarrhea.  Replace lost fluids and nutrients.  Prevent dehydration.  What general guidelines should I follow? Relieving diarrhea  Choose foods with less than 2 g or .07 oz. of fiber per serving.  Limit fats to less than 8 tsp (38 g or 1.34 oz.) a day.  Avoid the following: ? Foods and beverages sweetened with high-fructose corn syrup, honey, or sugar alcohols such as xylitol, sorbitol, and mannitol. ? Foods that contain a lot of fat or sugar. ? Fried, greasy, or spicy foods. ? High-fiber grains, breads, and cereals. ? Raw fruits and vegetables.  Eat foods that are rich in probiotics. These foods include dairy products such as yogurt and fermented milk products. They help increase healthy bacteria in the stomach and intestines (gastrointestinal tract, or GI tract).  If you have lactose intolerance, avoid dairy products. These may make your diarrhea worse.  Take medicine to help stop diarrhea (antidiarrheal medicine) only as told by your health care provider. Replacing nutrients  Eat small meals or snacks every 3-4 hours.  Eat bland foods, such as white rice, toast, or baked potato, until your diarrhea starts to get better. Gradually reintroduce nutrient-rich foods as tolerated or as told by your health care provider. This includes: ? Well-cooked protein foods. ? Peeled, seeded, and soft-cooked fruits and vegetables. ? Low-fat dairy products.  Take vitamin and mineral supplements as told by your health care provider. Preventing dehydration   Start by sipping water or a special solution to prevent  dehydration (oral rehydration solution, ORS). Urine that is clear or pale yellow means that you are getting enough fluid.  Try to drink at least 8-10 cups of fluid each day to help replace lost fluids.  You may add other liquids in addition to water, such as clear juice or decaffeinated sports drinks, as tolerated or as told by your health care provider.  Avoid drinks with caffeine, such as coffee, tea, or soft drinks.  Avoid alcohol. What foods are recommended? The items listed may not be a complete list. Talk with your health care provider about what dietary choices are best for you. Grains White rice. White, [place], or pita breads (fresh or toasted), including plain rolls, buns, or bagels. White pasta. Saltine, soda, or graham crackers. Pretzels. Low-fiber cereal. Cooked cereals made with water (such as cornmeal, farina, or cream cereals). Plain muffins. Matzo. Melba toast. Zwieback. Vegetables Potatoes (without the skin). Most well-cooked and canned vegetables without skins or seeds. Tender lettuce. Fruits Apple sauce. Fruits canned in juice. Cooked apricots, cherries, grapefruit, peaches, pears, or plums. Fresh bananas and cantaloupe. Meats and other protein foods Baked or boiled chicken. Eggs. Tofu. Fish. Seafood. Smooth nut butters. Ground or well-cooked tender beef, ham, veal, lamb, pork, or poultry. Dairy Plain yogurt, kefir, and unsweetened liquid yogurt. Lactose-free milk, buttermilk, skim milk, or soy milk. Low-fat or nonfat hard cheese. Beverages Water. Low-calorie sports drinks. Fruit juices without pulp. Strained tomato and vegetable juices. Decaffeinated teas. Sugar-free beverages not sweetened with sugar alcohols. Oral rehydration solutions, if approved by your health care provider. Seasoning and other foods Bouillon,  broth, or soups made from recommended foods. What foods are not recommended? The items listed may not be a complete list. Talk with your health care provider  about what dietary choices are best for you. Grains Whole grain, whole wheat, bran, or rye breads, rolls, pastas, and crackers. Wild or brown rice. Whole grain or bran cereals. Barley. Oats and oatmeal. Corn tortillas or taco shells. Granola. Popcorn. Vegetables Raw vegetables. Fried vegetables. Cabbage, broccoli, Brussels sprouts, artichokes, baked beans, beet greens, corn, kale, legumes, peas, sweet potatoes, and yams. Potato skins. Cooked spinach and cabbage. Fruits Dried fruit, including raisins and dates. Raw fruits. Stewed or dried prunes. Canned fruits with syrup. Meat and other protein foods Fried or fatty meats. Deli meats. Chunky nut butters. Nuts and seeds. Beans and lentils. Berniece Salines. Hot dogs. Sausage. Dairy High-fat cheeses. Whole milk, chocolate milk, and beverages made with milk, such as milk shakes. Half-and-half. Cream. sour cream. Ice cream. Beverages Caffeinated beverages (such as coffee, tea, soda, or energy drinks). Alcoholic beverages. Fruit juices with pulp. Prune juice. Soft drinks sweetened with high-fructose corn syrup or sugar alcohols. High-calorie sports drinks. Fats and oils Butter. Cream sauces. Margarine. Salad oils. Plain salad dressings. Olives. Avocados. Mayonnaise. Sweets and desserts Sweet rolls, doughnuts, and sweet breads. Sugar-free desserts sweetened with sugar alcohols such as xylitol and sorbitol. Seasoning and other foods Honey. Hot sauce. Chili powder. Gravy. Cream-based or milk-based soups. Pancakes and waffles. Summary  When you have diarrhea, the foods you eat and your eating habits are very important.  Make sure you get at least 8-10 cups of fluid each day, or enough to keep your urine clear or pale yellow.  Eat bland foods and gradually reintroduce healthy, nutrient-rich foods as tolerated, or as told by your health care provider.  Avoid high-fiber, fried, greasy, or spicy foods. This information is not intended to replace advice given to  you by your health care provider. Make sure you discuss any questions you have with your health care provider. Document Released: 02/27/2004 Document Revised: 12/04/2016 Document Reviewed: 12/04/2016 Elsevier Interactive Patient Education  2017 [employer].

## 2017-12-03 ENCOUNTER — Encounter: Payer: Self-pay | Admitting: Internal Medicine

## 2017-12-03 NOTE — Progress Notes (Signed)
cc'ed to pcp °

## 2017-12-06 ENCOUNTER — Ambulatory Visit (HOSPITAL_COMMUNITY)
Admission: RE | Admit: 2017-12-06 | Discharge: 2017-12-06 | Disposition: A | Discharge status: Home / Self Care | Payer: Medicare Other | Source: Ambulatory Visit | Attending: Gastroenterology | Admitting: Gastroenterology

## 2017-12-06 ENCOUNTER — Telehealth: Payer: Self-pay

## 2017-12-06 ENCOUNTER — Telehealth: Payer: Self-pay | Admitting: Internal Medicine

## 2017-12-06 DIAGNOSIS — R0789 Other chest pain: Secondary | ICD-10-CM | POA: Diagnosis not present

## 2017-12-06 DIAGNOSIS — I700 Atherosclerosis of aorta: Secondary | ICD-10-CM | POA: Diagnosis not present

## 2017-12-06 DIAGNOSIS — S2232XA Fracture of one rib, left side, initial encounter for closed fracture: Secondary | ICD-10-CM | POA: Diagnosis not present

## 2017-12-06 NOTE — Telephone Encounter (Signed)
Spoke with pts daughter, pt hasn't had a bowel movement since 11/29/17. Pt was asked to take miralax and pts daughter says her mother will not take the miralax. Pt isn't taking any otc stool softeners either. Can another medicine be recommended?

## 2017-12-06 NOTE — Telephone Encounter (Signed)
Patient daughter called and stated that the patient still has not had a bowel movement.  Will not drink the mirilax and wants to know if there is a pill she can take.  [phone] wendy daughter

## 2017-12-07 ENCOUNTER — Telehealth: Payer: Self-pay | Admitting: Internal Medicine

## 2017-12-07 MED ORDER — LUBIPROSTONE 8 MCG PO CAPS: 8.0000 ug | ORAL_CAPSULE | Freq: Two times a day (BID) | ORAL | 1 refills | Status: AC

## 2017-12-07 NOTE — Telephone Encounter (Signed)
See other phone note

## 2017-12-07 NOTE — Telephone Encounter (Signed)
If patient will not take the Miralax, we can try Amitiza 74mcg BID with food. RX sent to pharmacy. If no BM by end of today, if patient will tolerate gylcerin suppository I would recommend that.   Please also let patient know that her Rib films showed minimally displaced fracture of the anterior left fifth rib. Nothing can really be done for this but if persistent problems or questions I would direct them to PCP for this.

## 2017-12-07 NOTE — Telephone Encounter (Signed)
Ptd daughter notified of results and will pick medication up today.

## 2017-12-07 NOTE — Telephone Encounter (Signed)
Tried calling pts daughter, Mailbox is full. Will discuss with LSL.

## 2017-12-07 NOTE — Telephone Encounter (Signed)
Routing message 

## 2017-12-07 NOTE — Telephone Encounter (Signed)
Noted. See triage note. Pt has been called.

## 2017-12-07 NOTE — Telephone Encounter (Signed)
Pt isn't getting any better. Her daughter, Abigail Butts, would like for someone to call her ASAP and she is asking for LSL directly. Daughter is getting frustrated because no one has returned her calls from yesterday even though she did speak with nurse yesterday afternoon. Please call [phone]

## 2017-12-07 NOTE — Progress Notes (Signed)
See telephone note.

## 2017-12-10 ENCOUNTER — Telehealth: Payer: Self-pay | Admitting: Internal Medicine

## 2017-12-10 NOTE — Telephone Encounter (Signed)
Called pts daughter and she called pt already.

## 2017-12-10 NOTE — Telephone Encounter (Signed)
Pt's daughter, Abigail Butts, called again today to ask to speak with LSL. She said that her mother was seen last Friday and was given medicine and hasn't had a BM yet. She is worried about her mother getting dehydrated if she uses the glycerine suppository and patient hasn't been eating or drinking. She said the PCP isn't available to call this week and has concerns about what to do for her mother. She would like a call back today ASAP. [phone]

## 2017-12-10 NOTE — Telephone Encounter (Signed)
Spoke with patient's daughter, Abigail Butts.  Patient's last bowel movement around December 10.  No abdominal distention.  Abdominal pain actually improved.  Less rib pain.  She has taken Amitiza 21mcg twice daily for two days and no BM.  She is passing gas.  Always a difficult eater, has to stay on her to eat and drink.  She wanted to run by [place] whether or not she should try the glycerin suppository since she is not eaten and drinking a lot.  Advised her to give patient a glycerin suppository this morning.  Continuing Amitiza 8 mcg up to 3 times daily with food for the next 2 days.  Once she has a BM she can back down to twice daily.  Courage oral intake.  Monitor for dehydration.  If clinically worsens, take to the ED.    She will call with a progress report next week.

## 2018-02-02 ENCOUNTER — Ambulatory Visit: Payer: Medicare Other | Admitting: Gastroenterology

## 2018-02-12 NOTE — Progress Notes (Signed)
Patient cancelled recent appointment. Dr. Gala Romney recommended TCS due to abnormal colon on CT. As noted previously, daughter did not want to pursue colonoscopy unless mother did not get better.   Please NIC for CT abd with pancreatic protocol 11/2019.

## 2018-02-14 NOTE — Progress Notes (Signed)
ON RECALL  °

## 2018-06-09 DIAGNOSIS — C44329 Squamous cell carcinoma of skin of other parts of face: Secondary | ICD-10-CM | POA: Diagnosis not present

## 2018-06-09 DIAGNOSIS — Z08 Encounter for follow-up examination after completed treatment for malignant neoplasm: Secondary | ICD-10-CM | POA: Diagnosis not present

## 2018-06-09 DIAGNOSIS — C44321 Squamous cell carcinoma of skin of nose: Secondary | ICD-10-CM | POA: Diagnosis not present

## 2018-06-09 DIAGNOSIS — Z85828 Personal history of other malignant neoplasm of skin: Secondary | ICD-10-CM | POA: Diagnosis not present

## 2018-06-09 DIAGNOSIS — C44319 Basal cell carcinoma of skin of other parts of face: Secondary | ICD-10-CM | POA: Diagnosis not present

## 2018-08-26 DIAGNOSIS — R7309 Other abnormal glucose: Secondary | ICD-10-CM | POA: Diagnosis not present

## 2018-08-26 DIAGNOSIS — R8290 Unspecified abnormal findings in urine: Secondary | ICD-10-CM | POA: Diagnosis not present

## 2018-08-26 DIAGNOSIS — E782 Mixed hyperlipidemia: Secondary | ICD-10-CM | POA: Diagnosis not present

## 2018-08-26 DIAGNOSIS — F0390 Unspecified dementia without behavioral disturbance: Secondary | ICD-10-CM | POA: Diagnosis not present

## 2018-08-26 DIAGNOSIS — M1991 Primary osteoarthritis, unspecified site: Secondary | ICD-10-CM | POA: Diagnosis not present

## 2018-08-26 DIAGNOSIS — Z1389 Encounter for screening for other disorder: Secondary | ICD-10-CM | POA: Diagnosis not present

## 2018-08-26 DIAGNOSIS — Z0001 Encounter for general adult medical examination with abnormal findings: Secondary | ICD-10-CM | POA: Diagnosis not present

## 2018-08-26 DIAGNOSIS — K219 Gastro-esophageal reflux disease without esophagitis: Secondary | ICD-10-CM | POA: Diagnosis not present

## 2018-08-26 DIAGNOSIS — Z6822 Body mass index (BMI) 22.0-22.9, adult: Secondary | ICD-10-CM | POA: Diagnosis not present

## 2018-08-26 DIAGNOSIS — E785 Hyperlipidemia, unspecified: Secondary | ICD-10-CM | POA: Diagnosis not present

## 2018-08-29 DIAGNOSIS — K219 Gastro-esophageal reflux disease without esophagitis: Secondary | ICD-10-CM | POA: Diagnosis not present

## 2018-08-29 DIAGNOSIS — R8290 Unspecified abnormal findings in urine: Secondary | ICD-10-CM | POA: Diagnosis not present

## 2018-08-29 DIAGNOSIS — E782 Mixed hyperlipidemia: Secondary | ICD-10-CM | POA: Diagnosis not present

## 2018-08-29 DIAGNOSIS — Z1389 Encounter for screening for other disorder: Secondary | ICD-10-CM | POA: Diagnosis not present

## 2018-08-29 DIAGNOSIS — E785 Hyperlipidemia, unspecified: Secondary | ICD-10-CM | POA: Diagnosis not present

## 2018-11-10 ENCOUNTER — Other Ambulatory Visit: Payer: Self-pay

## 2018-12-13 ENCOUNTER — Emergency Department (HOSPITAL_COMMUNITY): Payer: Medicare Other

## 2018-12-13 ENCOUNTER — Other Ambulatory Visit: Payer: Self-pay

## 2018-12-13 ENCOUNTER — Emergency Department (HOSPITAL_COMMUNITY)
Admission: EM | Admit: 2018-12-13 | Discharge: 2018-12-13 | Disposition: A | Discharge status: Home / Self Care | Payer: Medicare Other | Attending: Emergency Medicine | Admitting: Emergency Medicine

## 2018-12-13 ENCOUNTER — Encounter (HOSPITAL_COMMUNITY): Payer: Self-pay | Admitting: Emergency Medicine

## 2018-12-13 DIAGNOSIS — M545 Low back pain, unspecified: Secondary | ICD-10-CM

## 2018-12-13 DIAGNOSIS — K8020 Calculus of gallbladder without cholecystitis without obstruction: Secondary | ICD-10-CM | POA: Diagnosis not present

## 2018-12-13 DIAGNOSIS — Z8781 Personal history of (healed) traumatic fracture: Secondary | ICD-10-CM

## 2018-12-13 DIAGNOSIS — Z79899 Other long term (current) drug therapy: Secondary | ICD-10-CM | POA: Insufficient documentation

## 2018-12-13 DIAGNOSIS — K449 Diaphragmatic hernia without obstruction or gangrene: Secondary | ICD-10-CM | POA: Diagnosis not present

## 2018-12-13 DIAGNOSIS — F0390 Unspecified dementia without behavioral disturbance: Secondary | ICD-10-CM | POA: Diagnosis not present

## 2018-12-13 LAB — URINALYSIS, ROUTINE W REFLEX MICROSCOPIC
BILIRUBIN URINE: NEGATIVE
GLUCOSE, UA: NEGATIVE mg/dL
Hgb urine dipstick: NEGATIVE
KETONES UR: NEGATIVE mg/dL
LEUKOCYTES UA: NEGATIVE
NITRITE: NEGATIVE
PROTEIN: NEGATIVE mg/dL
Specific Gravity, Urine: 1.021 (ref 1.005–1.030)
pH: 5 (ref 5.0–8.0)

## 2018-12-13 LAB — I-STAT CHEM 8, ED
BUN: 19 mg/dL (ref 8–23)
Calcium, Ion: 1.14 mmol/L — ABNORMAL LOW (ref 1.15–1.40)
Chloride: 105 mmol/L (ref 98–111)
Creatinine, Ser: 0.8 mg/dL (ref 0.44–1.00)
GLUCOSE: 127 mg/dL — AB (ref 70–99)
HCT: 39 % (ref 36.0–46.0)
HEMOGLOBIN: 13.3 g/dL (ref 12.0–15.0)
POTASSIUM: 3.7 mmol/L (ref 3.5–5.1)
Sodium: 140 mmol/L (ref 135–145)
TCO2: 28 mmol/L (ref 22–32)

## 2018-12-13 MED ORDER — HYDROCODONE-ACETAMINOPHEN 5-325 MG PO TABS
1.0000 | ORAL_TABLET | Freq: Once | ORAL | Status: AC
  Administered 2018-12-13: 1 via ORAL
  Filled 2018-12-13: qty 1

## 2018-12-13 MED ORDER — METHOCARBAMOL 500 MG PO TABS: 500.0000 mg | ORAL_TABLET | Freq: Two times a day (BID) | ORAL | 0 refills | Status: AC | PRN

## 2018-12-13 NOTE — ED Triage Notes (Signed)
Pt c/o of lower back pain with foul odor of urine x 3 days.

## 2018-12-13 NOTE — ED Provider Notes (Signed)
[facility] EMERGENCY DEPARTMENT Provider Note   CSN: [id number] Arrival date & time: 12/13/18  9024     History   Chief Complaint Chief Complaint  Patient presents with  . Back Pain    HPI Marisa Lawrence is a 84 y.o. female.  The history is provided by the patient, a relative and a caregiver. The history is limited by the condition of the patient (Hx dementia).  Back Pain    Pt was seen at 0935. Per pt and her family:  Pt has been c/o right sided LBP for the past 3 days. Family has been giving pt motrin with partial improvement. Family feels pt's urine has a "foul odor" also and are concerned regarding "a UTI or dehydration."  Pain seems to worsen with palpation of the area. Pt only intermittently will say "ow my back" while I am standing in the room. No reported incont/retention of bowel or bladder, no saddle anesthesia, no focal motor weakness, no tingling/numbness in extremities, no fevers, no injury, no abd pain, no N/V/D.     Past Medical History:  Diagnosis Date  . Acid reflux   . Dementia ([place])   . Hyperlipidemia     Patient Active Problem List   Diagnosis Date Noted  . Constipation 12/02/2017  . Left-sided chest wall pain 12/02/2017  . Colitis 12/02/2017  . Pancreatic cyst 12/02/2017    Past Surgical History:  Procedure Laterality Date  . ABDOMINAL HYSTERECTOMY       OB History    Gravida      Para      Term      Preterm      AB      Living  4     SAB      TAB      Ectopic      Multiple      Live Births               Home Medications    Prior to Admission medications   Medication Sig Start Date End Date Taking? Authorizing Provider  Cholecalciferol (VITAMIN D PO) Take 1 tablet by mouth daily.    [provider]  ciprofloxacin (CIPRO) 500 MG tablet Take 1 tablet (500 mg total) by mouth 2 (two) times daily. 11/29/17   Francine Graven, DO  donepezil (ARICEPT) 5 MG tablet Take 1 tablet by mouth daily. 01/31/17    [provider]  lubiprostone (AMITIZA) 8 MCG capsule Take 1 capsule (8 mcg total) by mouth 2 (two) times daily with a meal. 12/07/17   Mahala Menghini, PA-C  metroNIDAZOLE (FLAGYL) 500 MG tablet Take 1 tablet (500 mg total) by mouth 2 (two) times daily. 11/29/17   Francine Graven, DO  pantoprazole (PROTONIX) 40 MG tablet Take 40 mg by mouth daily. 10/05/17   [provider]    Family History Family History  Problem Relation Age of Onset  . Cancer Sister     Social History Social History   Tobacco Use  . Smoking status: Never Smoker  . Smokeless tobacco: Never Used  Substance Use Topics  . Alcohol use: No  . Drug use: No     Allergies   Codeine   Review of Systems Review of Systems  Unable to perform ROS: Dementia  Musculoskeletal: Positive for back pain.     Physical Exam Updated Vital Signs BP (!) 175/74 (BP Location: Right Arm)   Pulse 71   Temp 98.1 F (36.7 C) (Oral)  Resp 16   SpO2 96%   Physical Exam 0940: Physical examination:  Nursing notes reviewed; Vital signs and O2 SAT reviewed;  Constitutional: Well developed, Well nourished, Well hydrated, In no acute distress; Head:  Normocephalic, atraumatic; Eyes: EOMI, PERRL, No scleral icterus; ENMT: Mouth and pharynx normal, Mucous membranes moist; Neck: Supple, Full range of motion, No lymphadenopathy; Cardiovascular: Regular rate and rhythm, No gallop; Respiratory: Breath sounds clear & equal bilaterally, No wheezes.  Speaking full sentences with ease, Normal respiratory effort/excursion; Chest: Nontender, Movement normal; Abdomen: Soft, Nontender, Nondistended, Normal bowel sounds; Genitourinary: No CVA tenderness; Spine:  No midline CS, TS, LS tenderness. +TTP right lumbar paraspinal muscles.;;  Extremities: Peripheral pulses normal, No tenderness, No edema, No calf edema or asymmetry.; Neuro: Awake, alert, confused per hx dementia. Speech clear. Moves all extremities spontaneously and to  command without apparent gross focal motor deficits.; Skin: Color normal, Warm, Dry.    ED Treatments / Results  Labs (all labs ordered are listed, but only abnormal results are displayed)   EKG None  Radiology   Procedures Procedures (including critical care time)  Medications Ordered in ED Medications  HYDROcodone-acetaminophen (NORCO/VICODIN) 5-325 MG per tablet 1 tablet (has no administration in time range)     Initial Impression / Assessment and Plan / ED Course  I have reviewed the triage vital signs and the nursing notes.  Pertinent labs & imaging results that were available during my care of the patient were reviewed by me and considered in my medical decision making (see chart for details).  MDM Reviewed: previous chart, nursing note and vitals Reviewed previous: labs Interpretation: labs and CT scan   Results for orders placed or performed during the hospital encounter of 12/13/18  Urinalysis, Routine w reflex microscopic  Result Value Ref Range   Color, Urine YELLOW YELLOW   APPearance CLEAR CLEAR   Specific Gravity, Urine 1.021 1.005 - 1.030   pH 5.0 5.0 - 8.0   Glucose, UA NEGATIVE NEGATIVE mg/dL   Hgb urine dipstick NEGATIVE NEGATIVE   Bilirubin Urine NEGATIVE NEGATIVE   Ketones, ur NEGATIVE NEGATIVE mg/dL   Protein, ur NEGATIVE NEGATIVE mg/dL   Nitrite NEGATIVE NEGATIVE   Leukocytes, UA NEGATIVE NEGATIVE  I-stat Chem 8, ED  Result Value Ref Range   Sodium 140 135 - 145 mmol/L   Potassium 3.7 3.5 - 5.1 mmol/L   Chloride 105 98 - 111 mmol/L   BUN 19 8 - 23 mg/dL   Creatinine, Ser 0.80 0.44 - 1.00 mg/dL   Glucose, Bld 127 (H) 70 - 99 mg/dL   Calcium, Ion 1.14 (L) 1.15 - 1.40 mmol/L   TCO2 28 22 - 32 mmol/L   Hemoglobin 13.3 12.0 - 15.0 g/dL   HCT 39.0 36.0 - 46.0 %   Ct Renal Stone Study Result Date: 12/13/2018 CLINICAL DATA:  Low back pain for 3 days. EXAM: CT ABDOMEN AND PELVIS WITHOUT CONTRAST TECHNIQUE: Multidetector CT imaging of the  abdomen and pelvis was performed following the standard protocol without IV contrast. COMPARISON:  11/29/2017 FINDINGS: Lower chest: The heart is normal in size and stable. No pericardial effusion. Large hiatal hernia. Stable atherosclerotic calcifications involving the coronary arteries and thoracic aorta. No worrisome pulmonary lesions or acute pulmonary findings. Hepatobiliary: No focal hepatic lesions are identified. No intrahepatic biliary dilatation. The gallbladder is filled with small calcified gallstones. No CT findings to suggest acute cholecystitis. No common bile duct dilatation. Pancreas: The pancreatic atrophy but no mass, inflammation or ductal dilatation. Spleen:  Normal size.  No focal lesions are Adrenals/Urinary Tract: The adrenal glands are unremarkable. Stable right renal cyst. No renal, ureteral or bladder calculi or obvious mass. Stomach/Bowel: The stomach, duodenum, small bowel and colon are grossly normal without oral contrast. No acute inflammatory changes, mass lesions or obstructive findings. Severe descending and sigmoid colon diverticulosis without findings for acute diverticulitis. The terminal ileum and appendix are normal. Vascular/Lymphatic: Advanced atherosclerotic calcifications involving the aorta and iliac arteries but no focal aneurysm. Small scattered mesenteric and retroperitoneal lymph nodes but no mass or overt adenopathy the. Reproductive: The uterus is surgically absent. Other: No pelvic mass or adenopathy. No free pelvic fluid collections. No inguinal mass or adenopathy. No abdominal wall hernia or subcutaneous lesions. Musculoskeletal: No significant bony findings. Moderate to advanced osteoporosis and remote compression fractures of L1 and L3. IMPRESSION: 1. No renal, ureteral or bladder calculi or mass. 2. Cholelithiasis without definite CT findings for acute cholecystitis. 3. Large hiatal hernia. 4. Severe descending and sigmoid colon diverticulosis but no findings  for acute diverticulitis. 5. Remote compression fractures of L1 and L3.  No acute fractures. Electronically Signed   By: Marijo Sanes M.D.   On: 12/13/2018 12:40    1245:  Family states pt is "better" after pain meds. Discussion with family regarding pain meds and potential for AMS/increasing confusion/falls/etc. Family requesting "muscle relaxer" and they will dose APAP and motrin. They would like to take pt home now. Dx and testing d/w pt and family.  Questions answered.  Verb understanding, agreeable to d/c home with outpt f/u.    Final Clinical Impressions(s) / ED Diagnoses   Final diagnoses:  None    ED Discharge Orders    None       Francine Graven, DO 12/14/18 [id number]

## 2018-12-13 NOTE — Discharge Instructions (Signed)
Your CT scan showed remote compression fractures of L1 and L3, and no new fractures. Take the prescription as directed.  Take over the counter tylenol and ibuprofen, as directed on packaging, as needed for discomfort. Apply moist heat or ice to the area(s) of discomfort, for 15 minutes at a time, several times per day for the next few days.  Do not fall asleep on a heating or ice pack.  Call your regular medical doctor today to schedule a follow up appointment in the next 3 days. Return to the Emergency Department immediately if worsening.

## 2018-12-14 LAB — URINE CULTURE: CULTURE: NO GROWTH

## 2018-12-28 DIAGNOSIS — K8020 Calculus of gallbladder without cholecystitis without obstruction: Secondary | ICD-10-CM | POA: Diagnosis not present

## 2018-12-28 DIAGNOSIS — M545 Low back pain: Secondary | ICD-10-CM | POA: Diagnosis not present

## 2018-12-28 DIAGNOSIS — M5136 Other intervertebral disc degeneration, lumbar region: Secondary | ICD-10-CM | POA: Diagnosis not present

## 2018-12-28 DIAGNOSIS — K5730 Diverticulosis of large intestine without perforation or abscess without bleeding: Secondary | ICD-10-CM | POA: Diagnosis not present

## 2018-12-28 DIAGNOSIS — Z1389 Encounter for screening for other disorder: Secondary | ICD-10-CM | POA: Diagnosis not present

## 2018-12-28 DIAGNOSIS — Z6821 Body mass index (BMI) 21.0-21.9, adult: Secondary | ICD-10-CM | POA: Diagnosis not present

## 2019-02-21 DIAGNOSIS — J069 Acute upper respiratory infection, unspecified: Secondary | ICD-10-CM | POA: Diagnosis not present

## 2019-02-21 DIAGNOSIS — Z681 Body mass index (BMI) 19 or less, adult: Secondary | ICD-10-CM | POA: Diagnosis not present

## 2019-02-21 DIAGNOSIS — J029 Acute pharyngitis, unspecified: Secondary | ICD-10-CM | POA: Diagnosis not present

## 2019-03-03 DIAGNOSIS — Z23 Encounter for immunization: Secondary | ICD-10-CM | POA: Diagnosis not present

## 2019-03-06 IMAGING — DX DG HAND COMPLETE 3+V*L*
3 series · 3 of 3 positions shown · non-contrast
Comparison: None.

CLINICAL DATA: Pain following fall

EXAM:
LEFT HAND - COMPLETE 3+ VIEW

[hand pa]
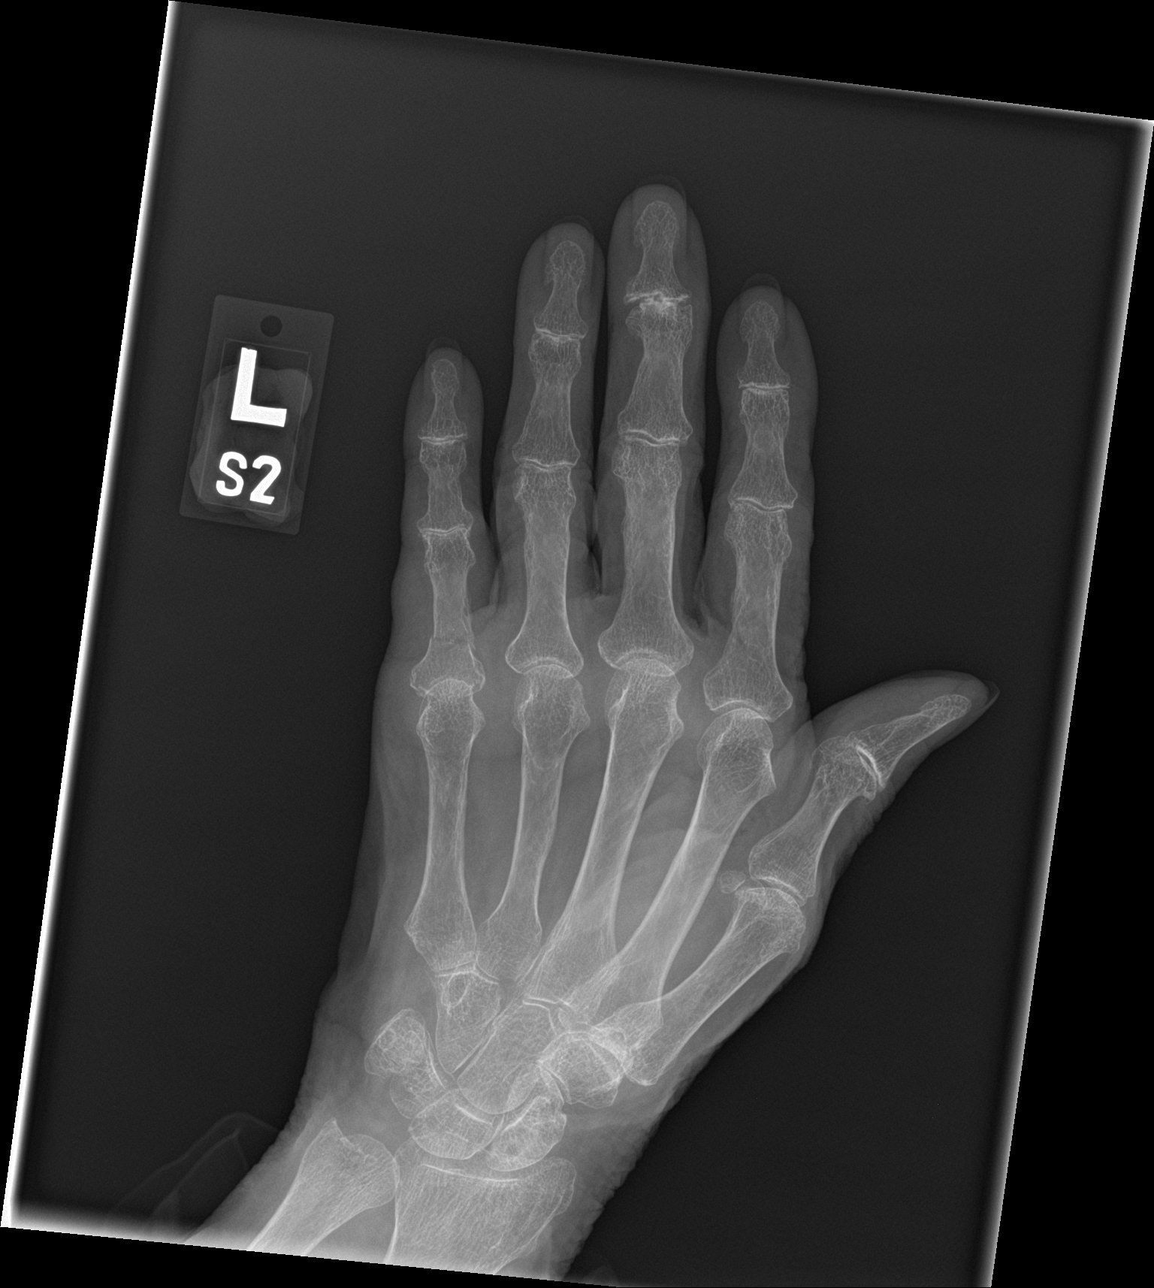

[hand obl]
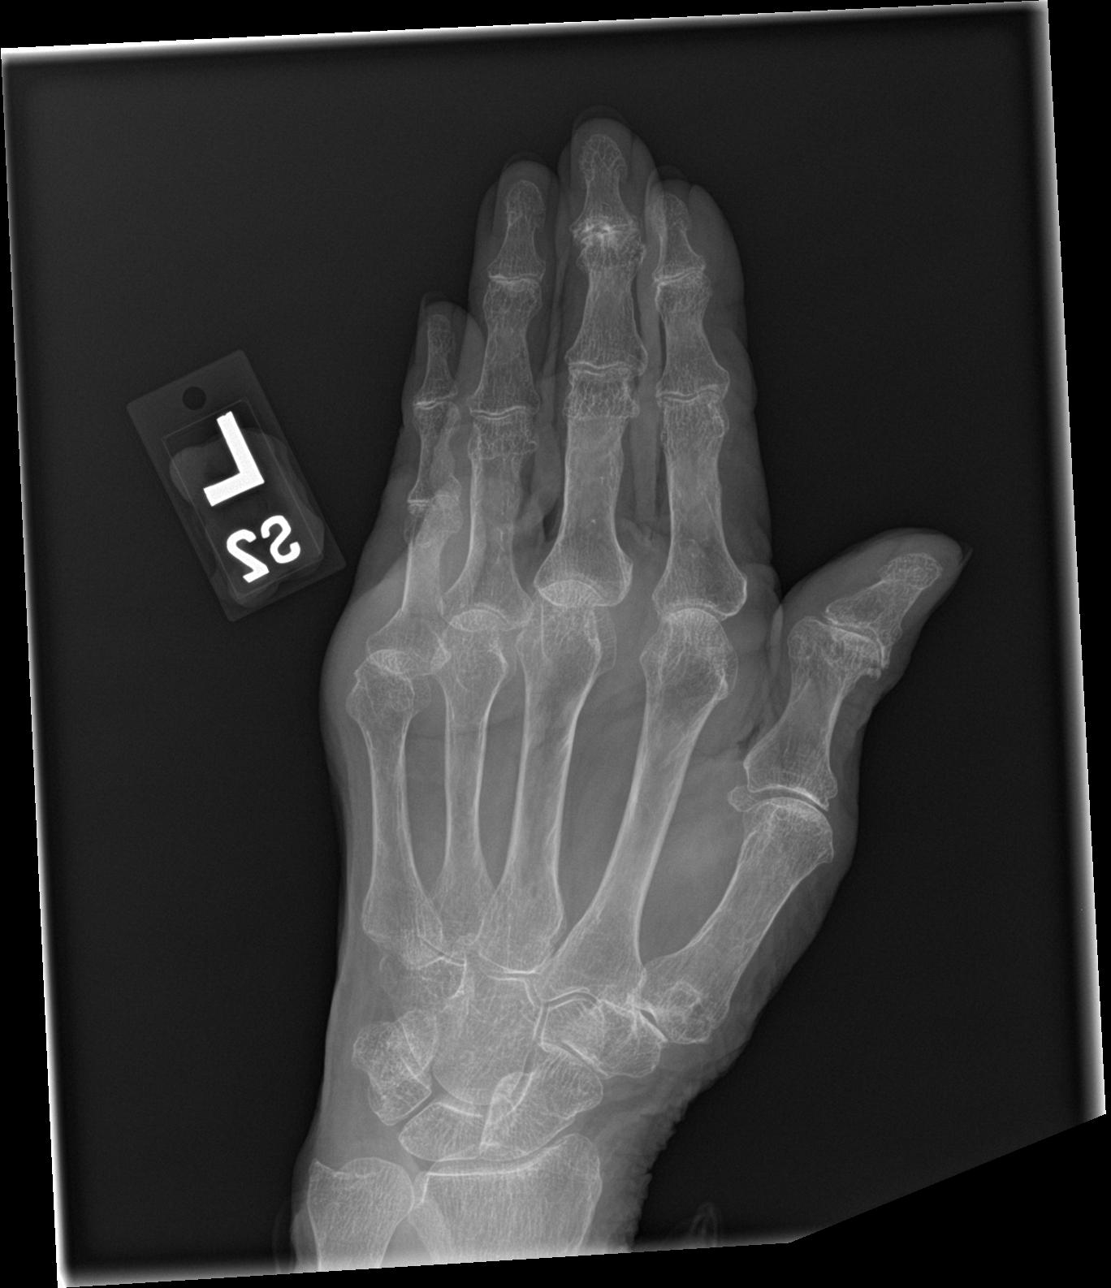

[hand lat]
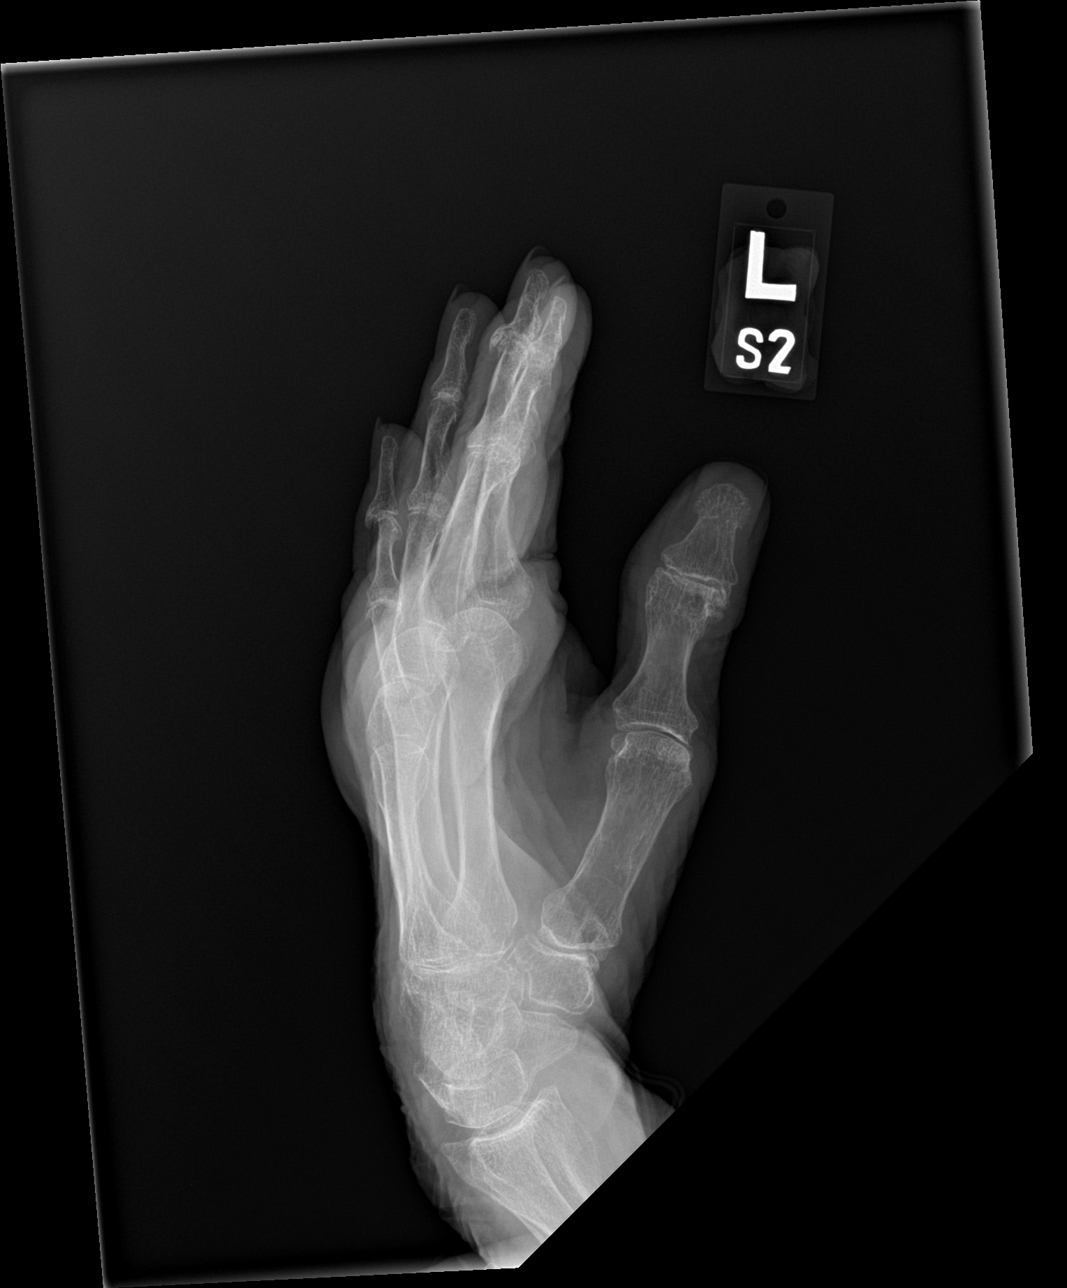

[3 of 3 positions shown; findings below may reference images not displayed]

FINDINGS: Frontal, oblique, and lateral views were obtained. There is a
fracture of the proximal aspect of the fifth proximal phalanx with
alignment near anatomic. No other fracture. No dislocation. Bones
are markedly osteoporotic. There is extensive osteoarthritic change
in all MCP, PIP, and DIP joints. There is also osteoarthritic change
in the scaphotrapezial and first carpal -metacarpal joints. No
erosive change.
IMPRESSION: Fracture proximal aspect fifth proximal phalanx with alignment near
anatomic. No other fracture. No dislocation. Multifocal
osteoarthritic change. Bones diffusely osteoporotic.

## 2019-04-11 DIAGNOSIS — R600 Localized edema: Secondary | ICD-10-CM | POA: Diagnosis not present

## 2019-04-13 DIAGNOSIS — Z681 Body mass index (BMI) 19 or less, adult: Secondary | ICD-10-CM | POA: Diagnosis not present

## 2019-04-13 DIAGNOSIS — B029 Zoster without complications: Secondary | ICD-10-CM | POA: Diagnosis not present

## 2019-05-04 ENCOUNTER — Encounter (HOSPITAL_COMMUNITY): Payer: Self-pay | Admitting: Emergency Medicine

## 2019-05-04 ENCOUNTER — Emergency Department (HOSPITAL_COMMUNITY): Payer: Medicare Other

## 2019-05-04 ENCOUNTER — Ambulatory Visit (HOSPITAL_COMMUNITY)
Admission: RE | Admit: 2019-05-04 | Discharge: 2019-05-04 | Disposition: A | Discharge status: Home / Self Care | Payer: Medicare Other | Source: Ambulatory Visit | Attending: Family Medicine | Admitting: Family Medicine

## 2019-05-04 ENCOUNTER — Other Ambulatory Visit (HOSPITAL_COMMUNITY): Payer: Self-pay | Admitting: Family Medicine

## 2019-05-04 ENCOUNTER — Other Ambulatory Visit: Payer: Self-pay

## 2019-05-04 ENCOUNTER — Other Ambulatory Visit: Payer: Self-pay | Admitting: Family Medicine

## 2019-05-04 ENCOUNTER — Inpatient Hospital Stay (HOSPITAL_COMMUNITY)
Admission: EM | Admit: 2019-05-04 | Discharge: 2019-05-22 | DRG: 208 | Disposition: E | Payer: Medicare Other | Source: Ambulatory Visit | Attending: Internal Medicine | Admitting: Internal Medicine

## 2019-05-04 DIAGNOSIS — I82401 Acute embolism and thrombosis of unspecified deep veins of right lower extremity: Secondary | ICD-10-CM | POA: Diagnosis present

## 2019-05-04 DIAGNOSIS — Z885 Allergy status to narcotic agent status: Secondary | ICD-10-CM | POA: Diagnosis not present

## 2019-05-04 DIAGNOSIS — J9602 Acute respiratory failure with hypercapnia: Secondary | ICD-10-CM | POA: Diagnosis not present

## 2019-05-04 DIAGNOSIS — I4891 Unspecified atrial fibrillation: Secondary | ICD-10-CM | POA: Diagnosis present

## 2019-05-04 DIAGNOSIS — I468 Cardiac arrest due to other underlying condition: Secondary | ICD-10-CM | POA: Diagnosis present

## 2019-05-04 DIAGNOSIS — M7989 Other specified soft tissue disorders: Secondary | ICD-10-CM

## 2019-05-04 DIAGNOSIS — I7090 Unspecified atherosclerosis: Secondary | ICD-10-CM | POA: Diagnosis not present

## 2019-05-04 DIAGNOSIS — I82431 Acute embolism and thrombosis of right popliteal vein: Secondary | ICD-10-CM | POA: Diagnosis not present

## 2019-05-04 DIAGNOSIS — I2699 Other pulmonary embolism without acute cor pulmonale: Principal | ICD-10-CM | POA: Diagnosis present

## 2019-05-04 DIAGNOSIS — I959 Hypotension, unspecified: Secondary | ICD-10-CM | POA: Diagnosis not present

## 2019-05-04 DIAGNOSIS — Z789 Other specified health status: Secondary | ICD-10-CM

## 2019-05-04 DIAGNOSIS — Z20828 Contact with and (suspected) exposure to other viral communicable diseases: Secondary | ICD-10-CM | POA: Diagnosis present

## 2019-05-04 DIAGNOSIS — I2694 Multiple subsegmental pulmonary emboli without acute cor pulmonale: Secondary | ICD-10-CM | POA: Diagnosis not present

## 2019-05-04 DIAGNOSIS — E876 Hypokalemia: Secondary | ICD-10-CM | POA: Diagnosis present

## 2019-05-04 DIAGNOSIS — J9601 Acute respiratory failure with hypoxia: Secondary | ICD-10-CM | POA: Diagnosis not present

## 2019-05-04 DIAGNOSIS — E785 Hyperlipidemia, unspecified: Secondary | ICD-10-CM | POA: Diagnosis present

## 2019-05-04 DIAGNOSIS — Z90710 Acquired absence of both cervix and uterus: Secondary | ICD-10-CM

## 2019-05-04 DIAGNOSIS — L8990 Pressure ulcer of unspecified site, unspecified stage: Secondary | ICD-10-CM

## 2019-05-04 DIAGNOSIS — L89152 Pressure ulcer of sacral region, stage 2: Secondary | ICD-10-CM | POA: Diagnosis present

## 2019-05-04 DIAGNOSIS — Z7401 Bed confinement status: Secondary | ICD-10-CM

## 2019-05-04 DIAGNOSIS — I469 Cardiac arrest, cause unspecified: Secondary | ICD-10-CM | POA: Diagnosis not present

## 2019-05-04 DIAGNOSIS — K219 Gastro-esophageal reflux disease without esophagitis: Secondary | ICD-10-CM | POA: Diagnosis present

## 2019-05-04 DIAGNOSIS — K449 Diaphragmatic hernia without obstruction or gangrene: Secondary | ICD-10-CM | POA: Diagnosis not present

## 2019-05-04 DIAGNOSIS — Z66 Do not resuscitate: Secondary | ICD-10-CM | POA: Diagnosis not present

## 2019-05-04 DIAGNOSIS — N179 Acute kidney failure, unspecified: Secondary | ICD-10-CM | POA: Diagnosis not present

## 2019-05-04 DIAGNOSIS — I82411 Acute embolism and thrombosis of right femoral vein: Secondary | ICD-10-CM | POA: Diagnosis present

## 2019-05-04 DIAGNOSIS — M79604 Pain in right leg: Secondary | ICD-10-CM

## 2019-05-04 DIAGNOSIS — Z978 Presence of other specified devices: Secondary | ICD-10-CM

## 2019-05-04 DIAGNOSIS — Z86718 Personal history of other venous thrombosis and embolism: Secondary | ICD-10-CM | POA: Diagnosis not present

## 2019-05-04 DIAGNOSIS — Z79899 Other long term (current) drug therapy: Secondary | ICD-10-CM | POA: Diagnosis not present

## 2019-05-04 DIAGNOSIS — I700 Atherosclerosis of aorta: Secondary | ICD-10-CM | POA: Diagnosis not present

## 2019-05-04 DIAGNOSIS — F0390 Unspecified dementia without behavioral disturbance: Secondary | ICD-10-CM | POA: Diagnosis present

## 2019-05-04 DIAGNOSIS — J9690 Respiratory failure, unspecified, unspecified whether with hypoxia or hypercapnia: Secondary | ICD-10-CM

## 2019-05-04 LAB — CBC WITH DIFFERENTIAL/PLATELET
Abs Immature Granulocytes: 0.07 10*3/uL (ref 0.00–0.07)
Basophils Absolute: 0.1 10*3/uL (ref 0.0–0.1)
Basophils Relative: 1 %
Eosinophils Absolute: 0 10*3/uL (ref 0.0–0.5)
Eosinophils Relative: 0 %
HCT: 45.2 % (ref 36.0–46.0)
Hemoglobin: 14.7 g/dL (ref 12.0–15.0)
Immature Granulocytes: 1 %
Lymphocytes Relative: 14 %
Lymphs Abs: 1.3 10*3/uL (ref 0.7–4.0)
MCH: 29.2 pg (ref 26.0–34.0)
MCHC: 32.5 g/dL (ref 30.0–36.0)
MCV: 89.7 fL (ref 80.0–100.0)
Monocytes Absolute: 0.8 10*3/uL (ref 0.1–1.0)
Monocytes Relative: 8 %
Neutro Abs: 7.2 10*3/uL (ref 1.7–7.7)
Neutrophils Relative %: 76 %
Platelets: 269 10*3/uL (ref 150–400)
RBC: 5.04 MIL/uL (ref 3.87–5.11)
RDW: 14.1 % (ref 11.5–15.5)
WBC: 9.4 10*3/uL (ref 4.0–10.5)
nRBC: 0 % (ref 0.0–0.2)

## 2019-05-04 LAB — COMPREHENSIVE METABOLIC PANEL
ALT: 8 U/L (ref 0–44)
AST: 16 U/L (ref 15–41)
Albumin: 3.7 g/dL (ref 3.5–5.0)
Alkaline Phosphatase: 66 U/L (ref 38–126)
Anion gap: 11 (ref 5–15)
BUN: 23 mg/dL (ref 8–23)
CO2: 29 mmol/L (ref 22–32)
Calcium: 9 mg/dL (ref 8.9–10.3)
Chloride: 93 mmol/L — ABNORMAL LOW (ref 98–111)
Creatinine, Ser: 0.93 mg/dL (ref 0.44–1.00)
GFR calc Af Amer: >60 mL/min (ref >60)
GFR calc non Af Amer: 54 mL/min — ABNORMAL LOW (ref >60)
Glucose, Bld: 133 mg/dL — ABNORMAL HIGH (ref 70–99)
Potassium: 2.8 mmol/L — ABNORMAL LOW (ref 3.5–5.1)
Sodium: 133 mmol/L — ABNORMAL LOW (ref 135–145)
Total Bilirubin: 1.5 mg/dL — ABNORMAL HIGH (ref 0.3–1.2)
Total Protein: 6.9 g/dL (ref 6.5–8.1)

## 2019-05-04 LAB — PROTIME-INR
INR: 1 (ref 0.8–1.2)
Prothrombin Time: 12.8 seconds (ref 11.4–15.2)

## 2019-05-04 LAB — SARS CORONAVIRUS 2 BY RT PCR (HOSPITAL ORDER, PERFORMED IN ~~LOC~~ HOSPITAL LAB): SARS Coronavirus 2: NEGATIVE

## 2019-05-04 LAB — TROPONIN I: Troponin I: 0.04 ng/mL

## 2019-05-04 LAB — MAGNESIUM: Magnesium: 2 mg/dL (ref 1.7–2.4)

## 2019-05-04 LAB — PHOSPHORUS: Phosphorus: 3 mg/dL (ref 2.5–4.6)

## 2019-05-04 MED ORDER — POTASSIUM CHLORIDE 10 MEQ/100ML IV SOLN
10.0000 meq | Freq: Once | INTRAVENOUS | Status: AC
  Administered 2019-05-04: 22:00:00 10 meq via INTRAVENOUS
  Filled 2019-05-04: qty 100

## 2019-05-04 MED ORDER — DILTIAZEM HCL 100 MG IV SOLR
5.0000 mg/h | INTRAVENOUS | Status: DC
  Administered 2019-05-04: 19:00:00 5 mg/h via INTRAVENOUS
  Administered 2019-05-05: 02:00:00 10 mg/h via INTRAVENOUS
  Filled 2019-05-04 (×2): qty 100

## 2019-05-04 MED ORDER — HEPARIN (PORCINE) 25000 UT/250ML-% IV SOLN: 14.0000 [IU]/kg/h | INTRAVENOUS | Status: DC

## 2019-05-04 MED ORDER — HEPARIN BOLUS VIA INFUSION
2750.0000 [IU] | Freq: Once | INTRAVENOUS | Status: AC
  Administered 2019-05-04: 20:00:00 2750 [IU] via INTRAVENOUS

## 2019-05-04 MED ORDER — MAGNESIUM SULFATE 2 GM/50ML IV SOLN
2.0000 g | Freq: Once | INTRAVENOUS | Status: AC
  Administered 2019-05-04: 21:00:00 2 g via INTRAVENOUS
  Filled 2019-05-04: qty 50

## 2019-05-04 MED ORDER — POTASSIUM CHLORIDE 10 MEQ/100ML IV SOLN
10.0000 meq | Freq: Once | INTRAVENOUS | Status: DC
  Filled 2019-05-04: qty 100

## 2019-05-04 MED ORDER — HEPARIN SODIUM (PORCINE) 5000 UNIT/ML IJ SOLN: 60.0000 [IU]/kg | Freq: Once | INTRAMUSCULAR | Status: DC

## 2019-05-04 MED ORDER — IOHEXOL 350 MG/ML SOLN
100.0000 mL | Freq: Once | INTRAVENOUS | Status: AC | PRN
  Administered 2019-05-04: 21:00:00 100 mL via INTRAVENOUS

## 2019-05-04 MED ORDER — POTASSIUM CHLORIDE CRYS ER 20 MEQ PO TBCR
40.0000 meq | EXTENDED_RELEASE_TABLET | Freq: Once | ORAL | Status: AC
  Administered 2019-05-04: 40 meq via ORAL
  Filled 2019-05-04: qty 2

## 2019-05-04 MED ORDER — ACETAMINOPHEN 650 MG RE SUPP: 650.0000 mg | Freq: Four times a day (QID) | RECTAL | Status: DC | PRN

## 2019-05-04 MED ORDER — HEPARIN (PORCINE) 25000 UT/250ML-% IV SOLN
650.0000 [IU]/h | INTRAVENOUS | Status: DC
  Administered 2019-05-04: 20:00:00 850 [IU]/h via INTRAVENOUS
  Filled 2019-05-04: qty 250

## 2019-05-04 MED ORDER — PROCHLORPERAZINE EDISYLATE 10 MG/2ML IJ SOLN: 5.0000 mg | INTRAMUSCULAR | Status: DC | PRN

## 2019-05-04 MED ORDER — SODIUM CHLORIDE 0.9 % IV SOLN
INTRAVENOUS | Status: DC
  Administered 2019-05-04: 18:00:00 via INTRAVENOUS

## 2019-05-04 MED ORDER — DILTIAZEM LOAD VIA INFUSION
5.0000 mg | Freq: Once | INTRAVENOUS | Status: AC
  Administered 2019-05-04: 19:00:00 5 mg via INTRAVENOUS
  Filled 2019-05-04: qty 5

## 2019-05-04 MED ORDER — ACETAMINOPHEN 325 MG PO TABS: 650.0000 mg | ORAL_TABLET | Freq: Four times a day (QID) | ORAL | Status: DC | PRN

## 2019-05-04 NOTE — ED Triage Notes (Signed)
RT lower leg swelling x 1 week.  Had [place] today and was sent to ED.

## 2019-05-04 NOTE — H&P (Addendum)
History and Physical    Marisa Lawrence ZOX:[id number] DOB: [birth year] DOA: 04/28/2019  PCP: Sharilyn Sites, MD   Patient coming from: Home.  I have personally briefly reviewed patient's old medical records in [place]  Chief Complaint: Right leg swelling.  HPI: Marisa Lawrence is a 85 y.o. female with medical history significant of GERD, dementia, hyperlipidemia, bigeminy who is coming to the emergency department due to right lower extremity edema for a week.  The patient was seen by Dr. Hilma Favors, who was sent to her for right lower extremity venous Doppler and to the ED.The patient is unable to provide further history, but the daughter stated earlier to Dr. Franky Macho has been mostly bed ridden lately.  Her appetite has been decreased and she has not been drinking or eating much.  ED Course: Initial vital signs temperature 98.3 F, pulse 78, respirations 18, blood pressure 125/68 mmHg and O2 sat 100% on room air.  While in the ER, the patient became tachycardic with an irregularly irregular rhythm.  She was started on IV heparin and IV diltiazem infusion.  Her CBC was normal.  PT 12.8 seconds and INR 1.0.  Magnesium was 2.0 and phosphorus 3.0 mg/dL. Sodium 133, potassium 2.8, chloride 93 and CO2 29 mmol/L.  Glucose 133 and total bilirubin 1.5 mg/dL.  The rest of the CMP values are within expected limits.  EKG likely A. fib, but he could also be tachycardia with artifact.  COVID-19 nasal swab was negative.  Imaging: Doppler ultrasound showed right lower extremity DVT.  A one-view portable chest radiograph did not show any acute disease.  CTA chest was positive for pulmonary embolism.  Please see images and full radiology report for further detail.  Review of Systems: Unable to obtain.  Past Medical History:  Diagnosis Date   Acid reflux    Dementia ([place])    Hyperlipidemia     Past Surgical History:  Procedure Laterality Date   ABDOMINAL HYSTERECTOMY       reports that  she has never smoked. She has never used smokeless tobacco. She reports that she does not drink alcohol or use drugs.  Allergies  Allergen Reactions   Codeine     Family History  Problem Relation Age of Onset   Cancer Sister    Prior to Admission medications   Medication Sig Start Date End Date Taking? Authorizing Provider  Cholecalciferol (VITAMIN D PO) Take 1 tablet by mouth daily.   Yes [provider]  donepezil (ARICEPT) 5 MG tablet Take 1 tablet by mouth daily. 01/31/17  Yes [provider]  furosemide (LASIX) 40 MG tablet Take 40 mg by mouth.   Yes [provider]  pantoprazole (PROTONIX) 40 MG tablet Take 40 mg by mouth daily. 10/05/17  Yes [provider]  ciprofloxacin (CIPRO) 500 MG tablet Take 1 tablet (500 mg total) by mouth 2 (two) times daily. Patient not taking: Reported on 12/13/2018 11/29/17   Francine Graven, DO  lubiprostone (AMITIZA) 8 MCG capsule Take 1 capsule (8 mcg total) by mouth 2 (two) times daily with a meal. Patient not taking: Reported on 12/13/2018 12/07/17   Mahala Menghini, PA-C  methocarbamol (ROBAXIN) 500 MG tablet Take 1 tablet (500 mg total) by mouth 2 (two) times daily as needed for muscle spasms. Patient not taking: Reported on 05/13/2019 12/13/18   Francine Graven, DO  metroNIDAZOLE (FLAGYL) 500 MG tablet Take 1 tablet (500 mg total) by mouth 2 (two) times daily. Patient not taking:  Reported on 12/13/2018 11/29/17   Francine Graven, DO    Physical Exam: Vitals:   05/20/2019 1845 04/22/2019 1900 04/23/2019 1950 04/24/2019 2000  BP:  (!) 145/87 (!) 143/73 (!) 132/92  Pulse: (!) 102 (!) 128 (!) 103 95  Resp: 17 (!) 23 18 (!) 21  Temp:      TempSrc:      SpO2:    98%  Weight:        Constitutional: NAD, calm, comfortable Eyes: PERRL, lids and conjunctivae are mildly injected. ENMT: Mucous membranes are moist. Posterior pharynx clear of any exudate or lesions. Neck: normal, supple, no masses, no  thyromegaly Respiratory: clear to auscultation bilaterally, no wheezing, no crackles. Normal respiratory effort. No accessory muscle use.  Cardiovascular: Irregularly irregular at 103 bpm, no murmurs / rubs / gallops. 1+ lower extremity pitting edema. 2+ pedal pulses. No carotid bruits.  Abdomen: Soft, no tenderness, no masses palpated. No hepatosplenomegaly. Bowel sounds positive.  Musculoskeletal: no clubbing / cyanosis. Good ROM, no contractures. Normal muscle tone.  Skin: stage 1-2 saccral pressure injury. Neurologic: Moves all extremities, but has some difficulty following simple commands. Psychiatric: Alert, oriented to name only.  Labs on Admission: I have personally reviewed following labs and imaging studies  CBC: Recent Labs  Lab 05/07/2019 1800  WBC 9.4  NEUTROABS 7.2  HGB 14.7  HCT 45.2  MCV 89.7  PLT 017   Basic Metabolic Panel: Recent Labs  Lab 05/21/2019 1800  NA 133*  K 2.8*  CL 93*  CO2 29  GLUCOSE 133*  BUN 23  CREATININE 0.93  CALCIUM 9.0   GFR: CrCl cannot be calculated (Unknown ideal weight.). Liver Function Tests: Recent Labs  Lab 05/06/2019 1800  AST 16  ALT 8  ALKPHOS 66  BILITOT 1.5*  PROT 6.9  ALBUMIN 3.7   No results for input(s): LIPASE, AMYLASE in the last 168 hours. No results for input(s): AMMONIA in the last 168 hours. Coagulation Profile: Recent Labs  Lab 05/16/2019 1800  INR 1.0   Cardiac Enzymes: No results for input(s): CKTOTAL, CKMB, CKMBINDEX, TROPONINI in the last 168 hours. BNP (last 3 results) No results for input(s): PROBNP in the last 8760 hours. HbA1C: No results for input(s): HGBA1C in the last 72 hours. CBG: No results for input(s): GLUCAP in the last 168 hours. Lipid Profile: No results for input(s): CHOL, HDL, LDLCALC, TRIG, CHOLHDL, LDLDIRECT in the last 72 hours. Thyroid Function Tests: No results for input(s): TSH, T4TOTAL, FREET4, T3FREE, THYROIDAB in the last 72 hours. Anemia Panel: No results for  input(s): VITAMINB12, FOLATE, FERRITIN, TIBC, IRON, RETICCTPCT in the last 72 hours. Urine analysis:    Component Value Date/Time   COLORURINE YELLOW 12/13/2018 [place] 12/13/2018 0938   LABSPEC 1.021 12/13/2018 0938   PHURINE 5.0 12/13/2018 0938   GLUCOSEU NEGATIVE 12/13/2018 0938   HGBUR NEGATIVE 12/13/2018 0938   BILIRUBINUR NEGATIVE 12/13/2018 0938   KETONESUR NEGATIVE 12/13/2018 0938   PROTEINUR NEGATIVE 12/13/2018 0938   UROBILINOGEN 1.0 01/25/2014 2135   NITRITE NEGATIVE 12/13/2018 0938   LEUKOCYTESUR NEGATIVE 12/13/2018 7939    Radiological Exams on Admission: US Venous Img Lower Unilateral Right  Result Date: 05/06/2019 CLINICAL DATA:  Right leg pain and swelling. EXAM: Right LOWER EXTREMITY VENOUS DOPPLER ULTRASOUND TECHNIQUE: Gray-scale sonography with graded compression, as well as color Doppler and duplex ultrasound were performed to evaluate the lower extremity deep venous systems from the level of the common femoral vein and including the common femoral,  femoral, profunda femoral, popliteal and calf veins including the posterior tibial, peroneal and gastrocnemius veins when visible. The superficial great saphenous vein was also interrogated. Spectral Doppler was utilized to evaluate flow at rest and with distal augmentation maneuvers in the common femoral, femoral and popliteal veins. COMPARISON:  None. FINDINGS: Contralateral Common Femoral Vein: Respiratory phasicity is normal and symmetric with the symptomatic side. No evidence of thrombus. Normal compressibility. Common Femoral Vein: Noncompressible with no flow consistent with occlusive thrombus. Saphenofemoral Junction: Noncompressible with no flow consistent with occlusive thrombus. Profunda Femoral Vein: Noncompressible with no flow consistent with occlusive thrombus. Femoral Vein: Noncompressible with no flow consistent with occlusive thrombus. Popliteal Vein: Non occlusive with no flow consistent with  occlusive thrombus. Calf Veins: Peroneal vein is not visualized. Superficial Great Saphenous Vein: No evidence of thrombus. Normal compressibility. Venous Reflux:  None. Other Findings:  None. IMPRESSION: Deep venous thrombosis is seen involving the right common femoral, profunda femoral, superficial femoral and popliteal veins. These results will be called to the ordering clinician or representative by the Radiologist Assistant, and communication documented in the PACS or zVision Dashboard. Electronically Signed   By: Marijo Conception M.D.   On: 05/19/2019 17:41   Dg Chest Port 1 View  Result Date: 05/02/2019 CLINICAL DATA:  Right lower extremity swelling for 1 week. EXAM: PORTABLE CHEST 1 VIEW COMPARISON:  Single-view of the chest 12/06/2017. FINDINGS: Lungs are clear. Heart size is normal. Hiatal hernia is again seen. No pneumothorax or pleural effusion. Aortic atherosclerosis is noted. No acute or focal bony abnormality. IMPRESSION: No acute disease. Small hiatal hernia. Atherosclerosis. Electronically Signed   By: Inge Rise M.D.   On: 05/15/2019 18:40    EKG: Independently reviewed. Vent. rate 113 BPM PR interval * ms QRS duration 123 ms QT/QTc 359/493 ms P-R-T axes 72 -84 67 Sinus tachycardia Paired ventricular premature complexes Nonspecific IVCD with LAD Borderline T abnormalities, lateral leads Baseline wander in lead(s) II III aVR aVL aVF V2 Interpretation limited secondary to artifact  Assessment/Plan Principal Problem:   Atrial fibrillation with RVR (HCC) CHA?DS?-VASc Score of at least 4. Admit to stepdown/inpatient. Continue supplemental oxygen. Continue heparin infusion. Continue Cardizem infusion. Trend troponin level. Follow-up EKG. Check echocardiogram. Consult cardiology in a.m.  Active Problems:   Pulmonary emboli (HCC) Continue heparin infusion. Trend troponin level. Check echocardiogram.    Acute deep vein thrombosis (DVT) of right lower extremity  (HCC) On heparin.    Hyperlipidemia Not on medical therapy. Heart healthy diet.    Dementia (HCC) Continue Aricept 5 mg p.o. daily.    Acid reflux Continue Protonix 40 mg p.o. daily.    Hypokalemia Replacing. Magnesium was supplemented. Follow-up potassium level.    DVT prophylaxis: On heparin infusion. Code Status: Full code. Family Communication: Disposition Plan: Admit for A. fib with RVR, PE and DVT treatment/further evaluation. Consults called: Routine cardiology evaluation in a.m. Admission status: Inpatient/stepdown.   Reubin Milan MD [facility]  04/25/2019, 8:47 PM   This document was prepared using Dragon voice recognition software and may contain some unintended transcription errors.

## 2019-05-04 NOTE — Progress Notes (Signed)
  ANTICOAGULATION CONSULT NOTE - Initial Consult  Pharmacy Consult for heparin Indication: VTE treatment  Allergies  Allergen Reactions  . Codeine     Patient Measurements: Weight: 113 lb (51.3 kg) Heparin Dosing Weight:  Vital Signs: Temp: 98.3 F (36.8 C) (05/14 1733) Temp Source: Oral (05/14 1733) BP: 125/68 (05/14 1733) Pulse Rate: 78 (05/14 1733)  Labs: Recent Labs    05/03/2019 1800  HGB 14.7  HCT 45.2  PLT 269  CREATININE 0.93    CrCl cannot be calculated (Unknown ideal weight.).   Medical History: Past Medical History:  Diagnosis Date  . Acid reflux   . Dementia ([place])   . Hyperlipidemia     Medications:  (Not in a hospital admission)   Assessment: Pharmacy consulted to dose heparin in patient for VTE treatment.  Patient has no anticoagulants listed on prior to admission medications.  Goal of Therapy:  Heparin level 0.3-0.7 units/ml Monitor platelets by anticoagulation protocol: Yes   Plan:  Give 2750 units bolus x 1 Start heparin infusion at 850 units/hr Check anti-Xa level in 8 hours and daily while on heparin Continue to monitor H&H and platelets  Revonda Standard Aldo Sondgeroth 05/10/2019,6:58 PM  16-18

## 2019-05-04 NOTE — ED Provider Notes (Addendum)
[facility] EMERGENCY DEPARTMENT Provider Note   CSN: [id number] Arrival date & time: 05/04/2019  1727    History   Chief Complaint Chief Complaint  Patient presents with  . Leg Swelling    HPI Marisa Lawrence is a 85 y.o. female.     Patient sent over from ultrasound for positive Doppler study of the right lower extremity for extensive DVT in the thigh area.  Including popliteal superficial deep femoral.  And including the common femoral.  Patient has been fairly bedridden of late.  Her daughter states she has not been eating and drinking very well.  Patient's past medical history is significant for some dementia.  Hyperlipidemia.  No obvious shortness of breath.     Past Medical History:  Diagnosis Date  . Acid reflux   . Dementia ([place])   . Hyperlipidemia     Patient Active Problem List   Diagnosis Date Noted  . Constipation 12/02/2017  . Left-sided chest wall pain 12/02/2017  . Colitis 12/02/2017  . Pancreatic cyst 12/02/2017    Past Surgical History:  Procedure Laterality Date  . ABDOMINAL HYSTERECTOMY       OB History    Gravida      Para      Term      Preterm      AB      Living  4     SAB      TAB      Ectopic      Multiple      Live Births               Home Medications    Prior to Admission medications   Medication Sig Start Date End Date Taking? Authorizing Provider  Cholecalciferol (VITAMIN D PO) Take 1 tablet by mouth daily.    [provider]  ciprofloxacin (CIPRO) 500 MG tablet Take 1 tablet (500 mg total) by mouth 2 (two) times daily. Patient not taking: Reported on 12/13/2018 11/29/17   Francine Graven, DO  donepezil (ARICEPT) 5 MG tablet Take 1 tablet by mouth daily. 01/31/17   [provider]  ibuprofen (ADVIL,MOTRIN) 200 MG tablet Take 200 mg by mouth every 6 (six) hours as needed for headache or mild pain.    [provider]  lubiprostone (AMITIZA) 8 MCG capsule Take 1 capsule (8 mcg  total) by mouth 2 (two) times daily with a meal. Patient not taking: Reported on 12/13/2018 12/07/17   Mahala Menghini, PA-C  methocarbamol (ROBAXIN) 500 MG tablet Take 1 tablet (500 mg total) by mouth 2 (two) times daily as needed for muscle spasms. 12/13/18   Francine Graven, DO  metroNIDAZOLE (FLAGYL) 500 MG tablet Take 1 tablet (500 mg total) by mouth 2 (two) times daily. Patient not taking: Reported on 12/13/2018 11/29/17   Francine Graven, DO  pantoprazole (PROTONIX) 40 MG tablet Take 40 mg by mouth daily. 10/05/17   [provider]    Family History Family History  Problem Relation Age of Onset  . Cancer Sister     Social History Social History   Tobacco Use  . Smoking status: Never Smoker  . Smokeless tobacco: Never Used  Substance Use Topics  . Alcohol use: No  . Drug use: No     Allergies   Codeine   Review of Systems Review of Systems  Constitutional: Positive for activity change and appetite change. Negative for chills and fever.  HENT: Negative for rhinorrhea and sore throat.  Eyes: Negative for visual disturbance.  Respiratory: Negative for cough and shortness of breath.   Cardiovascular: Positive for leg swelling. Negative for chest pain.  Gastrointestinal: Negative for abdominal pain, diarrhea, nausea and vomiting.  Genitourinary: Negative for dysuria.  Musculoskeletal: Negative for back pain and neck pain.  Skin: Negative for rash.  Neurological: Negative for dizziness, light-headedness and headaches.  Hematological: Does not bruise/bleed easily.  Psychiatric/Behavioral: Positive for confusion.     Physical Exam Updated Vital Signs BP 125/68 (BP Location: Right Arm)   Pulse 78   Temp 98.3 F (36.8 C) (Oral)   Resp 18   SpO2 100%   Physical Exam Vitals signs and nursing note reviewed.  Constitutional:      General: She is not in acute distress.    Appearance: Normal appearance. She is well-developed.  HENT:     Head:  Normocephalic and atraumatic.  Eyes:     Extraocular Movements: Extraocular movements intact.     Conjunctiva/sclera: Conjunctivae normal.     Pupils: Pupils are equal, round, and reactive to light.  Neck:     Musculoskeletal: Normal range of motion and neck supple.  Cardiovascular:     Rate and Rhythm: Tachycardia present. Rhythm irregular.     Heart sounds: Normal heart sounds. No murmur.  Pulmonary:     Effort: Pulmonary effort is normal. No respiratory distress.     Breath sounds: Normal breath sounds.  Abdominal:     General: Bowel sounds are normal.     Palpations: Abdomen is soft.     Tenderness: There is no abdominal tenderness.  Musculoskeletal: Normal range of motion.     Right lower leg: Edema present.     Left lower leg: No edema.     Comments: Patient with cyst has significant swelling of the right lower extremity from the knee down.  Good cap refill to the foot.  Calf is not particularly tight.  Really no thigh swelling at all.  Left lower extremity without any significant edema.  Also no significant swelling.  Skin:    General: Skin is warm and dry.     Capillary Refill: Capillary refill takes less than 2 seconds.  Neurological:     Mental Status: She is alert. Mental status is at baseline.      ED Treatments / Results  Labs (all labs ordered are listed, but only abnormal results are displayed) Labs Reviewed  COMPREHENSIVE METABOLIC PANEL - Abnormal; Notable for the following components:      Result Value   Sodium 133 (*)    Potassium 2.8 (*)    Chloride 93 (*)    Glucose, Bld 133 (*)    Total Bilirubin 1.5 (*)    GFR calc non Af Amer 54 (*)    All other components within normal limits  CBC WITH DIFFERENTIAL/PLATELET  URINALYSIS, ROUTINE W REFLEX MICROSCOPIC    EKG None  Radiology US Venous Img Lower Unilateral Right  Result Date: 05/20/2019 CLINICAL DATA:  Right leg pain and swelling. EXAM: Right LOWER EXTREMITY VENOUS DOPPLER ULTRASOUND TECHNIQUE:  Gray-scale sonography with graded compression, as well as color Doppler and duplex ultrasound were performed to evaluate the lower extremity deep venous systems from the level of the common femoral vein and including the common femoral, femoral, profunda femoral, popliteal and calf veins including the posterior tibial, peroneal and gastrocnemius veins when visible. The superficial great saphenous vein was also interrogated. Spectral Doppler was utilized to evaluate flow at rest and with distal augmentation  maneuvers in the common femoral, femoral and popliteal veins. COMPARISON:  None. FINDINGS: Contralateral Common Femoral Vein: Respiratory phasicity is normal and symmetric with the symptomatic side. No evidence of thrombus. Normal compressibility. Common Femoral Vein: Noncompressible with no flow consistent with occlusive thrombus. Saphenofemoral Junction: Noncompressible with no flow consistent with occlusive thrombus. Profunda Femoral Vein: Noncompressible with no flow consistent with occlusive thrombus. Femoral Vein: Noncompressible with no flow consistent with occlusive thrombus. Popliteal Vein: Non occlusive with no flow consistent with occlusive thrombus. Calf Veins: Peroneal vein is not visualized. Superficial Great Saphenous Vein: No evidence of thrombus. Normal compressibility. Venous Reflux:  None. Other Findings:  None. IMPRESSION: Deep venous thrombosis is seen involving the right common femoral, profunda femoral, superficial femoral and popliteal veins. These results will be called to the ordering clinician or representative by the Radiologist Assistant, and communication documented in the PACS or zVision Dashboard. Electronically Signed   By: Marijo Conception M.D.   On: 05/12/2019 17:41    Procedures Procedures (including critical care time)  CRITICAL CARE Performed by: Fredia Sorrow Total critical care time: 30 minutes Critical care time was exclusive of separately billable procedures and  treating other patients. Critical care was necessary to treat or prevent imminent or life-threatening deterioration. Critical care was time spent personally by me on the following activities: development of treatment plan with patient and/or surrogate as well as nursing, discussions with consultants, evaluation of patient's response to treatment, examination of patient, obtaining history from patient or surrogate, ordering and performing treatments and interventions, ordering and review of laboratory studies, ordering and review of radiographic studies, pulse oximetry and re-evaluation of patient's condition.   Medications Ordered in ED Medications  0.9 %  sodium chloride infusion ( Intravenous New Bag/Given 05/12/2019 1823)  diltiazem (CARDIZEM) 1 mg/mL load via infusion 5 mg (has no administration in time range)    And  diltiazem (CARDIZEM) 100 mg in dextrose 5 % 100 mL (1 mg/mL) infusion (has no administration in time range)     Initial Impression / Assessment and Plan / ED Course  I have reviewed the triage vital signs and the nursing notes.  Pertinent labs & imaging results that were available during my care of the patient were reviewed by me and considered in my medical decision making (see chart for details).       Patient in atrial fibrillation with a rapid heart rate.  Patient received diltiazem 5 mg bolus and then a drip.  Patient without a known history of atrial fibrillation.  Patient also with hypokalemia.  Potassium of 2.8.  Patient will receive IV potassium 10 mEq x 2.  Patient is getting CT angios renal function is reasonable for the CT angios this is to rule out pulmonary embolus in the face of the severe and extensive deep vein thrombosis of the right lower extremity all in the thigh.  There is swelling to the lower part of the extremity no evidence significant tightness.  And good cap refill to the toes.  Because of all the extenuating circumstances we will start the  patient on heparin.  Admitting team can switch her over.  Patient will require admission for the atrial fibrillation alone.  And also requires admission for the hypokalemia.  Risk factor for the DVT is that patient is essentially nonambulatory.  According to daughter patient also not eating or drinking well.   Final Clinical Impressions(s) / ED Diagnoses   Final diagnoses:  Acute deep vein thrombosis (DVT) of femoral vein  of right lower extremity ([place])  Atrial fibrillation with RVR [facility])    ED Discharge Orders    None       Fredia Sorrow, MD 05/20/2019 1919    Fredia Sorrow, MD 04/30/2019 [phone]

## 2019-05-04 NOTE — ED Notes (Signed)
Date and time results received: 05/14/202100 (use smartphrase ".now" to insert current time)  Test: Troponin Critical Value: 0.04  Name of Provider Notified: Dr Olevia Bowens  Orders Received? Or Actions Taken?:

## 2019-05-04 NOTE — ED Notes (Signed)
Patient transported to CT 

## 2019-05-05 ENCOUNTER — Ambulatory Visit (HOSPITAL_COMMUNITY): Payer: Medicare Other

## 2019-05-05 ENCOUNTER — Inpatient Hospital Stay (HOSPITAL_COMMUNITY): Payer: Medicare Other

## 2019-05-05 DIAGNOSIS — K219 Gastro-esophageal reflux disease without esophagitis: Secondary | ICD-10-CM

## 2019-05-05 DIAGNOSIS — I82411 Acute embolism and thrombosis of right femoral vein: Secondary | ICD-10-CM

## 2019-05-05 DIAGNOSIS — L8990 Pressure ulcer of unspecified site, unspecified stage: Secondary | ICD-10-CM

## 2019-05-05 DIAGNOSIS — I2699 Other pulmonary embolism without acute cor pulmonale: Principal | ICD-10-CM

## 2019-05-05 DIAGNOSIS — J9601 Acute respiratory failure with hypoxia: Secondary | ICD-10-CM

## 2019-05-05 DIAGNOSIS — E785 Hyperlipidemia, unspecified: Secondary | ICD-10-CM

## 2019-05-05 DIAGNOSIS — E876 Hypokalemia: Secondary | ICD-10-CM

## 2019-05-05 DIAGNOSIS — I469 Cardiac arrest, cause unspecified: Secondary | ICD-10-CM

## 2019-05-05 DIAGNOSIS — F0390 Unspecified dementia without behavioral disturbance: Secondary | ICD-10-CM

## 2019-05-05 LAB — CBC
HCT: 42 % (ref 36.0–46.0)
Hemoglobin: 13.6 g/dL (ref 12.0–15.0)
MCH: 29.6 pg (ref 26.0–34.0)
MCHC: 32.4 g/dL (ref 30.0–36.0)
MCV: 91.3 fL (ref 80.0–100.0)
Platelets: 176 10*3/uL (ref 150–400)
RBC: 4.6 MIL/uL (ref 3.87–5.11)
RDW: 14.2 % (ref 11.5–15.5)
WBC: 6.5 10*3/uL (ref 4.0–10.5)
nRBC: 0 % (ref 0.0–0.2)

## 2019-05-05 LAB — BASIC METABOLIC PANEL
Anion gap: 16 — ABNORMAL HIGH (ref 5–15)
BUN: 26 mg/dL — ABNORMAL HIGH (ref 8–23)
CO2: 19 mmol/L — ABNORMAL LOW (ref 22–32)
Calcium: 8.3 mg/dL — ABNORMAL LOW (ref 8.9–10.3)
Chloride: 101 mmol/L (ref 98–111)
Creatinine, Ser: 1.57 mg/dL — ABNORMAL HIGH (ref 0.44–1.00)
GFR calc Af Amer: 34 mL/min — ABNORMAL LOW (ref >60)
GFR calc non Af Amer: 29 mL/min — ABNORMAL LOW (ref >60)
Glucose, Bld: 147 mg/dL — ABNORMAL HIGH (ref 70–99)
Potassium: 2.9 mmol/L — ABNORMAL LOW (ref 3.5–5.1)
Sodium: 136 mmol/L (ref 135–145)

## 2019-05-05 LAB — MRSA PCR SCREENING: MRSA by PCR: NEGATIVE

## 2019-05-05 LAB — BLOOD GAS, ARTERIAL
Acid-base deficit: 9.6 mmol/L — ABNORMAL HIGH (ref 0.0–2.0)
Bicarbonate: 17.2 mmol/L — ABNORMAL LOW (ref 20.0–28.0)
FIO2: 40
O2 Saturation: 95.2 %
Patient temperature: 37.3
pCO2 arterial: 27.1 mmHg — ABNORMAL LOW (ref 32.0–48.0)
pH, Arterial: 7.356 (ref 7.350–7.450)
pO2, Arterial: 83.4 mmHg (ref 83.0–108.0)

## 2019-05-05 LAB — HEPARIN LEVEL (UNFRACTIONATED): Heparin Unfractionated: 1.22 IU/mL — ABNORMAL HIGH (ref 0.30–0.70)

## 2019-05-05 LAB — GLUCOSE, CAPILLARY: Glucose-Capillary: 104 mg/dL — ABNORMAL HIGH (ref 70–99)

## 2019-05-05 MED ORDER — NOREPINEPHRINE 4 MG/250ML-% IV SOLN
0.0000 ug/min | INTRAVENOUS | Status: DC
  Administered 2019-05-05: 08:00:00 11 ug/min via INTRAVENOUS
  Filled 2019-05-05: qty 250

## 2019-05-05 MED ORDER — FENTANYL CITRATE (PF) 100 MCG/2ML IJ SOLN
12.5000 ug | INTRAMUSCULAR | Status: DC | PRN
  Administered 2019-05-05: 12:00:00 12.5 ug via INTRAVENOUS
  Filled 2019-05-05: qty 2

## 2019-05-05 MED ORDER — FENTANYL CITRATE (PF) 100 MCG/2ML IJ SOLN
12.5000 ug | INTRAMUSCULAR | Status: DC | PRN
  Administered 2019-05-05: 08:00:00 25 ug via INTRAVENOUS
  Filled 2019-05-05: qty 2

## 2019-05-05 MED ORDER — LORAZEPAM 1 MG PO TABS: 1.0000 mg | ORAL_TABLET | ORAL | Status: DC | PRN

## 2019-05-05 MED ORDER — POTASSIUM CHLORIDE 10 MEQ/100ML IV SOLN
10.0000 meq | INTRAVENOUS | Status: DC
  Administered 2019-05-05: 09:00:00 10 meq via INTRAVENOUS
  Filled 2019-05-05 (×2): qty 100

## 2019-05-05 MED ORDER — CHLORHEXIDINE GLUCONATE CLOTH 2 % EX PADS
6.0000 | MEDICATED_PAD | Freq: Every day | CUTANEOUS | Status: DC
  Administered 2019-05-05: 09:00:00 6 via TOPICAL

## 2019-05-05 MED ORDER — LORAZEPAM 2 MG/ML IJ SOLN: 1.0000 mg | INTRAMUSCULAR | Status: DC | PRN

## 2019-05-05 MED ORDER — FENTANYL CITRATE (PF) 100 MCG/2ML IJ SOLN: 25.0000 ug | INTRAMUSCULAR | Status: DC | PRN

## 2019-05-05 MED ORDER — FENTANYL CITRATE (PF) 100 MCG/2ML IJ SOLN
25.0000 ug | INTRAMUSCULAR | Status: DC | PRN
  Administered 2019-05-05: 10:00:00 100 ug via INTRAVENOUS
  Filled 2019-05-05: qty 2

## 2019-05-05 MED ORDER — FENTANYL CITRATE (PF) 100 MCG/2ML IJ SOLN
25.0000 ug | INTRAMUSCULAR | Status: DC | PRN
  Administered 2019-05-05: 02:00:00 25 ug via INTRAVENOUS
  Filled 2019-05-05: qty 2

## 2019-05-05 MED ORDER — LORAZEPAM 2 MG/ML PO CONC: 1.0000 mg | ORAL | Status: DC | PRN

## 2019-05-05 MED ORDER — NOREPINEPHRINE 4 MG/250ML-% IV SOLN
INTRAVENOUS | Status: AC
  Filled 2019-05-05: qty 250

## 2019-05-08 MED FILL — Medication: Qty: 1 | Status: AC

## 2019-05-22 NOTE — Progress Notes (Signed)
PROGRESS NOTE    BRIANCA FORTENBERRY  WLS:[id number] DOB: January 23, 1928 DOA: 05/03/2019 PCP: Sharilyn Sites, MD     Brief Narrative:  84 y.o. female with medical history significant of GERD, dementia, hyperlipidemia, bigeminy who is coming to the emergency department due to right lower extremity edema for a week.  The patient was seen by Dr. Hilma Favors, who was sent to her for right lower extremity venous Doppler and to the ED.The patient is unable to provide further history, but the daughter stated earlier to Dr. Franky Macho has been mostly bed ridden lately.  Her appetite has been decreased and she has not been drinking or eating much.  ED Course: Initial vital signs temperature 98.3 F, pulse 78, respirations 18, blood pressure 125/68 mmHg and O2 sat 100% on room air.  While in the ER, the patient became tachycardic with an irregularly irregular rhythm.  She was started on IV heparin and IV diltiazem infusion.  Her CBC was normal.  PT 12.8 seconds and INR 1.0.  Magnesium was 2.0 and phosphorus 3.0 mg/dL. Sodium 133, potassium 2.8, chloride 93 and CO2 29 mmol/L.  Glucose 133 and total bilirubin 1.5 mg/dL.  The rest of the CMP values are within expected limits.  EKG likely A. fib, but he could also be tachycardia with artifact.  COVID-19 nasal swab was negative.  Imaging: Doppler ultrasound showed right lower extremity DVT.  A one-view portable chest radiograph did not show any acute disease.  CTA chest was positive for pulmonary embolism.  Please see images and full radiology report for further detail.   Assessment & Plan: 1-PEA arrest in the setting of pulmonary embolism -Patient was successfully resuscitated and intubated -Further discussion with family members and living will previously expressed by patient, lead 4 transition into DNR, one-way extubation and full comfort care. -Family decided in no further blood work, pressures, 2D echo or any further interventions. -Medications to assist with:  4 has been provided -Family has been allow to come at bedside -Will assist with end-of-life caring.  2-Atrial fibrillation with RVR ([place]) -Initially control on Cardizem -Blood pressure significantly low and at this moment Cardizem drip discontinued. -Plan of care symptomatic management and comfort.  3-Hyperlipidemia -Statins and any other nonessential for comfort medications has been discontinued -Plan of care is for full comfort.  4-Dementia ([place]) -Aricept has been discontinued -Will focus on symptomatic management   5-acid reflux -Continue PPI  6-Hypokalemia -Potassium and magnesium were repleted -At this moment no further blood work will be attempted and will focus on symptomatic management on full comfort care.  7-Acute deep vein thrombosis (DVT) of right lower extremity (HCC) -With subsequent development of pulmonary embolism -As mentioned above patient was treated with heparin drip and in route to have further work-up including 2D echo; after discussing with family member heparin drip has been discontinued no echo will be pursued, no pressures, no further interventions. -Patient will be transition to full comfort care and symptomatic management.   DVT prophylaxis: Initially on heparin drip; now transitioning to full comfort care. Code Status: DNR/DNI Family Communication: Daughter over the phone Disposition Plan: Remains in the ICU, perform terminal extubation, transition care to full comfort.  Follow end-of-life care protocol.  Consultants:   Pulmonology  Procedures:   See below for x-ray reports  Antimicrobials:  Anti-infectives (From admission, onward)   None       Subjective: Intubated and mechanically ventilated; restless, with significant low blood pressure and in mild distress.  No fever.  Objective: Vitals:  05/19/19 0710 05-19-2019 0739 May 19, 2019 0745 05-19-19 0800  BP:  (!) 75/42 (!) 76/49 (!) 48/24  Pulse:   98   Resp:  (!) 31 (!) 22 (!) 29    Temp:      TempSrc:      SpO2: 94%  100%   Weight:      Height: 5\' 3"  (1.6 m)       Intake/Output Summary (Last 24 hours) at May 19, 2019 1039 Last data filed at 2019-05-19 0844 Gross per 24 hour  Intake 1164.97 ml  Output --  Net 1164.97 ml   Filed Weights   04/24/2019 1811 05/19/19 0000  Weight: 51.3 kg 45.4 kg    Examination: General exam: Afebrile, restless, no following commands, with too low blood pressure for sedatives at this point; intubated and mechanically ventilated.  Critically ill in appearance. Respiratory system: Decreased breath sounds at the bases, positive tachypnea and scattered rhonchi, no wheezing. Cardiovascular system: Irregular, no rubs, no gallops, no JVD Gastrointestinal system: Abdomen is nondistended, soft and nontender. No organomegaly or masses felt. Normal bowel sounds heard. Central nervous system: No focal neurological deficits appreciated.  Patient is moving 4 limbs spontaneously. Extremities: No cyanosis or clubbing.  Right lower extremity swelling appreciated on exam. Skin: No rashes, no petechiae. Psychiatry: Unable to properly assess in current situation.  Data Reviewed: I have personally reviewed following labs and imaging studies  CBC: Recent Labs  Lab 05/03/2019 1800 05-19-2019 0455  WBC 9.4 6.5  NEUTROABS 7.2  --   HGB 14.7 13.6  HCT 45.2 42.0  MCV 89.7 91.3  PLT 269 914   Basic Metabolic Panel: Recent Labs  Lab 05/03/2019 1800 05-19-2019 0455  NA 133* 136  K 2.8* 2.9*  CL 93* 101  CO2 29 19*  GLUCOSE 133* 147*  BUN 23 26*  CREATININE 0.93 1.57*  CALCIUM 9.0 8.3*  MG 2.0  --   PHOS 3.0  --    GFR: Estimated Creatinine Clearance: 17.4 mL/min (A) (by C-G formula based on SCr of 1.57 mg/dL (H)).   Liver Function Tests: Recent Labs  Lab 05/10/2019 1800  AST 16  ALT 8  ALKPHOS 66  BILITOT 1.5*  PROT 6.9  ALBUMIN 3.7   Coagulation Profile: Recent Labs  Lab 05/11/2019 1800  INR 1.0   Cardiac Enzymes: Recent Labs  Lab  05/21/2019 1800  TROPONINI 0.04*   CBG: Recent Labs  Lab May 19, 2019 0753  GLUCAP 104*   Urine analysis:    Component Value Date/Time   COLORURINE YELLOW 12/13/2018 [place] 12/13/2018 0938   LABSPEC 1.021 12/13/2018 0938   PHURINE 5.0 12/13/2018 0938   GLUCOSEU NEGATIVE 12/13/2018 0938   HGBUR NEGATIVE 12/13/2018 0938   BILIRUBINUR NEGATIVE 12/13/2018 0938   KETONESUR NEGATIVE 12/13/2018 0938   PROTEINUR NEGATIVE 12/13/2018 0938   UROBILINOGEN 1.0 01/25/2014 2135   NITRITE NEGATIVE 12/13/2018 0938   LEUKOCYTESUR NEGATIVE 12/13/2018 0938   Recent Results (from the past 240 hour(s))  SARS Coronavirus 2 (CEPHEID - Performed in [place] hospital lab), Hosp Order     Status: None   Collection Time: 05/18/2019  8:03 PM  Result Value Ref Range Status   SARS Coronavirus 2 NEGATIVE NEGATIVE Final    Comment: (NOTE) If result is NEGATIVE SARS-CoV-2 target nucleic acids are NOT DETECTED. The SARS-CoV-2 RNA is generally detectable in upper and lower  respiratory specimens during the acute phase of infection. The lowest  concentration of SARS-CoV-2 viral copies this assay can detect is  250  copies / mL. A negative result does not preclude SARS-CoV-2 infection  and should not be used as the sole basis for treatment or other  patient management decisions.  A negative result may occur with  improper specimen collection / handling, submission of specimen other  than nasopharyngeal swab, presence of viral mutation(s) within the  areas targeted by this assay, and inadequate number of viral copies  (<250 copies / mL). A negative result must be combined with clinical  observations, patient history, and epidemiological information. If result is POSITIVE SARS-CoV-2 target nucleic acids are DETECTED. The SARS-CoV-2 RNA is generally detectable in upper and lower  respiratory specimens dur ing the acute phase of infection.  Positive  results are indicative of active infection  with SARS-CoV-2.  Clinical  correlation with patient history and other diagnostic information is  necessary to determine patient infection status.  Positive results do  not rule out bacterial infection or co-infection with other viruses. If result is PRESUMPTIVE POSTIVE SARS-CoV-2 nucleic acids MAY BE PRESENT.   A presumptive positive result was obtained on the submitted specimen  and confirmed on repeat testing.  While 2019 novel coronavirus  (SARS-CoV-2) nucleic acids may be present in the submitted sample  additional confirmatory testing may be necessary for epidemiological  and / or clinical management purposes  to differentiate between  SARS-CoV-2 and other Sarbecovirus currently known to infect humans.  If clinically indicated additional testing with an alternate test  methodology [phone]) is advised. The SARS-CoV-2 RNA is generally  detectable in upper and lower respiratory sp ecimens during the acute  phase of infection. The expected result is Negative. Fact Sheet for Patients:  StrictlyIdeas.no Fact Sheet for Healthcare Providers: BankingDealers.co.za This test is not yet approved or cleared by the [place] FDA and has been authorized for detection and/or diagnosis of SARS-CoV-2 by FDA under an Emergency Use Authorization (EUA).  This EUA will remain in effect (meaning this test can be used) for the duration of the COVID-19 declaration under Section 564(b)(1) of the Act, 21 U.S.C. section 360bbb-3(b)(1), unless the authorization is terminated or revoked sooner. Performed at [facility], [address], [place], [place] [address]   MRSA PCR Screening     Status: None   Collection Time: 05/13/2019 10:30 PM  Result Value Ref Range Status   MRSA by PCR NEGATIVE NEGATIVE Final    Comment:        The GeneXpert MRSA Assay (FDA approved for NASAL specimens only), is one component of a comprehensive MRSA  colonization surveillance program. It is not intended to diagnose MRSA infection nor to guide or monitor treatment for MRSA infections. Performed at [facility], [address] [place], [place] [address]      Radiology Studies: Ct Angio Chest Pe W/cm &/or Wo Cm  Result Date: 05/11/2019 CLINICAL DATA:  History of deep venous thrombosis. EXAM: CT ANGIOGRAPHY CHEST WITH CONTRAST TECHNIQUE: Multidetector CT imaging of the chest was performed using the standard protocol during bolus administration of intravenous contrast. Multiplanar CT image reconstructions and MIPs were obtained to evaluate the vascular anatomy. CONTRAST:  146mL OMNIPAQUE IOHEXOL 350 MG/ML SOLN COMPARISON:  Radiograph of same day.  CT scan of April 11, 2011. FINDINGS: Cardiovascular: Atherosclerosis of thoracic aorta is noted without aneurysm or dissection. Large filling defects are noted in the lower lobe branches of the right pulmonary artery consistent with acute pulmonary emboli. Linear saddle embolus is also noted at the bifurcation of the main pulmonary artery. Normal cardiac  size. No pericardial effusion. RV/LV ratio of 0.8 is measured which is within normal limits. Mediastinum/Nodes: Large sliding-type hiatal hernia is noted. Large left thyroid mass is noted measuring 4.1 x 2.8 cm. No significant adenopathy is noted. Lungs/Pleura: No pneumothorax or pleural effusion is noted. Left lung is clear. Mild focal peripheral opacity is noted laterally in the right lower lobe which may represent some degree of pulmonary infarction. Upper Abdomen: Cholelithiasis is noted. Musculoskeletal: No chest wall abnormality. No acute or significant osseous findings. Review of the MIP images confirms the above findings. IMPRESSION: Multiple pulmonary emboli are noted in the lower lobe branches of the right pulmonary artery, with linear saddle embolus seen at the bifurcation main pulmonary artery. Mild peripheral opacity is noted laterally in the right  lower lobe which may represent pulmonary infarction. Critical Value/emergent results were called by telephone at the time of interpretation on 04/21/2019 at 9:12 pm to Dr. Wilson Singer, who verbally acknowledged these results. Large sliding-type hiatal hernia. Large left thyroid mass is noted which was present on prior exam of 2012. Cholelithiasis. Aortic Atherosclerosis (ICD10-I70.0). Electronically Signed   By: Marijo Conception M.D.   On: 05/06/2019 21:12   US Venous Img Lower Unilateral Right  Result Date: 04/22/2019 CLINICAL DATA:  Right leg pain and swelling. EXAM: Right LOWER EXTREMITY VENOUS DOPPLER ULTRASOUND TECHNIQUE: Gray-scale sonography with graded compression, as well as color Doppler and duplex ultrasound were performed to evaluate the lower extremity deep venous systems from the level of the common femoral vein and including the common femoral, femoral, profunda femoral, popliteal and calf veins including the posterior tibial, peroneal and gastrocnemius veins when visible. The superficial great saphenous vein was also interrogated. Spectral Doppler was utilized to evaluate flow at rest and with distal augmentation maneuvers in the common femoral, femoral and popliteal veins. COMPARISON:  None. FINDINGS: Contralateral Common Femoral Vein: Respiratory phasicity is normal and symmetric with the symptomatic side. No evidence of thrombus. Normal compressibility. Common Femoral Vein: Noncompressible with no flow consistent with occlusive thrombus. Saphenofemoral Junction: Noncompressible with no flow consistent with occlusive thrombus. Profunda Femoral Vein: Noncompressible with no flow consistent with occlusive thrombus. Femoral Vein: Noncompressible with no flow consistent with occlusive thrombus. Popliteal Vein: Non occlusive with no flow consistent with occlusive thrombus. Calf Veins: Peroneal vein is not visualized. Superficial Great Saphenous Vein: No evidence of thrombus. Normal compressibility. Venous  Reflux:  None. Other Findings:  None. IMPRESSION: Deep venous thrombosis is seen involving the right common femoral, profunda femoral, superficial femoral and popliteal veins. These results will be called to the ordering clinician or representative by the Radiologist Assistant, and communication documented in the PACS or zVision Dashboard. Electronically Signed   By: Marijo Conception M.D.   On: 04/23/2019 17:41   Dg Chest Port 1 View  Addendum Date: 05-09-2019   ADDENDUM REPORT: May 09, 2019 08:20 ADDENDUM: Study discussed by telephone with Dr. Dyann Kief on 2019/05/09 at 0816 hours. Electronically Signed   By: Genevie Ann M.D.   On: 05-09-2019 08:20   Result Date: 05-09-2019 CLINICAL DATA:  85 year old female with acute pulmonary emboli. Intubated. EXAM: PORTABLE CHEST 1 VIEW COMPARISON:  CTA chest and portable chest yesterday. FINDINGS: Portable AP supine view at 0715 hours. Endotracheal tube tip is at the carina. The patient is rotated to the left. Retrocardiac hypo ventilation appears stable. No pneumothorax or pulmonary edema. Stable cardiac size and mediastinal contours. Paucity of bowel gas. Excreted IV contrast in the right renal collecting system. IMPRESSION: 1. Endotracheal tube tip  at the carina. Retract 1-2 cm for optimal placement. 2. No new cardiopulmonary abnormality. Electronically Signed: By: Genevie Ann M.D. On: 2019/05/16 07:49   Dg Chest Port 1 View  Result Date: 05/12/2019 CLINICAL DATA:  Right lower extremity swelling for 1 week. EXAM: PORTABLE CHEST 1 VIEW COMPARISON:  Single-view of the chest 12/06/2017. FINDINGS: Lungs are clear. Heart size is normal. Hiatal hernia is again seen. No pneumothorax or pleural effusion. Aortic atherosclerosis is noted. No acute or focal bony abnormality. IMPRESSION: No acute disease. Small hiatal hernia. Atherosclerosis. Electronically Signed   By: Inge Rise M.D.   On: 05/16/2019 18:40    Scheduled Meds: Continuous Infusions:  sodium chloride 50 mL/hr at  May 16, 2019 0844   potassium chloride       LOS: 1 day    Time spent: 45 minutes. Greater than 50% of this time was spent in direct contact with the patient, coordinating care and discussing relevant ongoing clinical issues, including end of life discussion with family, update on hypotension and concerns big PE as triggering offending cause for her new atrial fibrillation. Family has decided to focus on full comfort, no anticoagulation, no Echo, no more blood work and no pressors. Nursing staff updated, will pursuit with one way extubation as per whole family and apparently living will decisions expressed by patient before this event.     Barton Dubois, MD [facility] Pager [phone]   May 16, 2019, 10:39 AM

## 2019-05-22 NOTE — Discharge Summary (Addendum)
Death Summary  Marisa Lawrence BJS:[id number] DOB: 09/03/1928 DOA: 05-07-19  PCP: Sharilyn Sites, MD PCP/Office notified: death summary sent electronically through [place].  Admit date: 2019-05-07 Date of Death: May 08, 2019  Final Diagnoses:  Principal Problem:   Atrial fibrillation with RVR ([place]) Active Problems:   Hyperlipidemia   Dementia (HCC)   Acid reflux   Hypokalemia   Acute deep vein thrombosis (DVT) of right lower extremity (HCC)   Pulmonary emboli (HCC)   Pressure injury of skin   History of present illness:  85 y.o. female with medical history significant of GERD, dementia, hyperlipidemia, bigeminy who is coming to the emergency department due to right lower extremity edema for a week.  The patient was seen by Dr. Hilma Favors, who was sent to her for right lower extremity venous Doppler and to the ED.The patient is unable to provide further history, but the daughter stated earlier to Dr. Franky Macho has been mostly bed ridden lately.  Her appetite has been decreased and she has not been drinking or eating much.  ED Course: Initial vital signs temperature 98.3 F, pulse 78, respirations 18, blood pressure 125/68 mmHg and O2 sat 100% on room air.  While in the ER, the patient became tachycardic with an irregularly irregular rhythm.  She was started on IV heparin and IV diltiazem infusion.  Her CBC was normal.  PT 12.8 seconds and INR 1.0.  Magnesium was 2.0 and phosphorus 3.0 mg/dL. Sodium 133, potassium 2.8, chloride 93 and CO2 29 mmol/L.  Glucose 133 and total bilirubin 1.5 mg/dL.  The rest of the CMP values are within expected limits.  EKG likely A. fib, but he could also be tachycardia with artifact.  COVID-19 nasal swab was negative.  Imaging: Doppler ultrasound showed right lower extremity DVT.  A one-view portable chest radiograph did not show any acute disease.  CTA chest was positive for pulmonary embolism.  Please see images and full radiology report for further  detail.   Hospital Course:  Patient was admitted on Cardizem drip and heparin drip to control of A. fib with RVR while providing acute anticoagulation therapy for acute right lower extremity DVT with pulmonary embolism component.  Patient blood pressure fluctuating throughout the night with a subsequent development of agonal breathing and PEA; she was successfully resuscitated intubated and started on Levophed.  Patient with negative COVID-19 test.  After further discussing with family members and reviewing expressed living will decision was made to transition patient into full comfort with compassionate/terminal extubation.  Medications initiated for end-of-life care using fentanyl and Ativan. Family allowed to be at bedside.  Around 13:02 patient comfortably expired.   Time: 25 minutes  Signed:  Barton Dubois  [facility] 08-May-2019, 3:06 PM

## 2019-05-22 NOTE — Progress Notes (Signed)
While doing final round, pt found with agonal respirations and no response to painful stimuli/deep sternal rub. Code blue initiated. Pt intubated without problem. Started patient on levophed for low BP. CXR done. Bilateral breath sounds confirmed. Spoke with daughter Abigail Butts, who was with her two sisters and they all three confirmed with Dr Olevia Bowens and myself to make the pt DNR from this point.

## 2019-05-22 NOTE — Consult Note (Signed)
Consult requested by: [facility] Consult requested for: Respiratory failure  HPI: This is an 85 year old who was admitted because of pulmonary emboli and atrial fib with rapid ventricular response.  She had been having swelling of her right lower extremity for about a week and was sent for evaluation.  She did have DVT on the venous Doppler and CT angiogram showed pulmonary emboli.  History is from the medical record as no family is available and she is intubated.  She is known to have dementia reflux hyperlipidemia bigeminy.  She had apparently been more bedridden lately appetite decreased and she is not been eating or drinking as much.  She was initially seen in the emergency department and seemed to be stable then she became tachycardic with atrial fib with RVR.  She was started on IV heparin and IV diltiazem.  Negative for COVID-19.  She had wildly fluctuating blood pressure through the night with question as to whether the readings are accurate.  She eventually developed agonal respirations and was intubated and placed on mechanical ventilation.  She initially was full code now has no CODE STATUS but I do not believe has transitioned entirely to comfort care.  She is still hypotensive.  She is on norepinephrine.  Past Medical History:  Diagnosis Date  . Acid reflux   . Dementia ([place])   . Hyperlipidemia      Family History  Problem Relation Age of Onset  . Cancer Sister      Social History   Socioeconomic History  . Marital status: Widowed    Spouse name: Not on file  . Number of children: Not on file  . Years of education: Not on file  . Highest education level: Not on file  Occupational History  . Not on file  Social Needs  . Financial resource strain: Not on file  . Food insecurity:    Worry: Not on file    Inability: Not on file  . Transportation needs:    Medical: Not on file    Non-medical: Not on file  Tobacco Use  . Smoking status: Never Smoker  . Smokeless  tobacco: Never Used  Substance and Sexual Activity  . Alcohol use: No  . Drug use: No  . Sexual activity: Not on file  Lifestyle  . Physical activity:    Days per week: Not on file    Minutes per session: Not on file  . Stress: Not on file  Relationships  . Social connections:    Talks on phone: Not on file    Gets together: Not on file    Attends religious service: Not on file    Active member of club or organization: Not on file    Attends meetings of clubs or organizations: Not on file    Relationship status: Not on file  Other Topics Concern  . Not on file  Social History Narrative  . Not on file     ROS: Unobtainable    Objective: Vital signs in last 24 hours: Temp:  [98.3 F (36.8 C)-99.2 F (37.3 C)] 99.2 F (37.3 C) (05/15 0400) Pulse Rate:  [27-128] 109 (05/15 0300) Resp:  [17-36] 24 (05/15 0500) BP: (51-193)/(36-156) 132/92 (05/15 0600) SpO2:  [94 %-100 %] 94 % (05/15 0710) FiO2 (%):  [40 %] 40 % (05/15 0710) Weight:  [45.4 kg-51.3 kg] 45.4 kg (05/15 0000) Weight change:     Intake/Output from previous day: 05/14 0701 - 05/15 0700 In: 760.8 [I.V.:710.8; IV Piggyback:50] Out: -  PHYSICAL EXAM Constitutional: She is intubated and sedated.  She is on mechanical ventilation.  Eyes: Pupils react ears nose mouth and throat: Throat is clear with the exception of tubing.  Cardiovascular: Her heart rate is somewhat irregular with a tachycardia at about 105 respiratory: Respiratory effort per the ventilator her lungs are fairly clear.  Gastrointestinal: Her abdomen soft without masses.  Musculoskeletal: Cannot assess neurological: Cannot assess psychiatric: Cannot assess  Lab Results: Basic Metabolic Panel: Recent Labs    05/08/2019 1800 05-11-2019 0455  NA 133* 136  K 2.8* 2.9*  CL 93* 101  CO2 29 19*  GLUCOSE 133* 147*  BUN 23 26*  CREATININE 0.93 1.57*  CALCIUM 9.0 8.3*  MG 2.0  --   PHOS 3.0  --    Liver Function Tests: Recent Labs     05/03/2019 1800  AST 16  ALT 8  ALKPHOS 66  BILITOT 1.5*  PROT 6.9  ALBUMIN 3.7   No results for input(s): LIPASE, AMYLASE in the last 72 hours. No results for input(s): AMMONIA in the last 72 hours. CBC: Recent Labs    05/20/2019 1800 May 11, 2019 0455  WBC 9.4 6.5  NEUTROABS 7.2  --   HGB 14.7 13.6  HCT 45.2 42.0  MCV 89.7 91.3  PLT 269 176   Cardiac Enzymes: Recent Labs    05/19/2019 1800  TROPONINI 0.04*   BNP: No results for input(s): PROBNP in the last 72 hours. D-Dimer: No results for input(s): DDIMER in the last 72 hours. CBG: No results for input(s): GLUCAP in the last 72 hours. Hemoglobin A1C: No results for input(s): HGBA1C in the last 72 hours. Fasting Lipid Panel: No results for input(s): CHOL, HDL, LDLCALC, TRIG, CHOLHDL, LDLDIRECT in the last 72 hours. Thyroid Function Tests: No results for input(s): TSH, T4TOTAL, FREET4, T3FREE, THYROIDAB in the last 72 hours. Anemia Panel: No results for input(s): VITAMINB12, FOLATE, FERRITIN, TIBC, IRON, RETICCTPCT in the last 72 hours. Coagulation: Recent Labs    05/09/2019 1800  LABPROT 12.8  INR 1.0   Urine Drug Screen: Drugs of Abuse  No results found for: LABOPIA, COCAINSCRNUR, LABBENZ, AMPHETMU, THCU, LABBARB  Alcohol Level: No results for input(s): ETH in the last 72 hours. Urinalysis: No results for input(s): COLORURINE, LABSPEC, PHURINE, GLUCOSEU, HGBUR, BILIRUBINUR, KETONESUR, PROTEINUR, UROBILINOGEN, NITRITE, LEUKOCYTESUR in the last 72 hours.  Invalid input(s): APPERANCEUR Misc. Labs:   ABGS: No results for input(s): PHART, PO2ART, TCO2, HCO3 in the last 72 hours.  Invalid input(s): PCO2   MICROBIOLOGY: Recent Results (from the past 240 hour(s))  SARS Coronavirus 2 (CEPHEID - Performed in [place] hospital lab), Hosp Order     Status: None   Collection Time: 05/03/2019  8:03 PM  Result Value Ref Range Status   SARS Coronavirus 2 NEGATIVE NEGATIVE Final    Comment: (NOTE) If result is  NEGATIVE SARS-CoV-2 target nucleic acids are NOT DETECTED. The SARS-CoV-2 RNA is generally detectable in upper and lower  respiratory specimens during the acute phase of infection. The lowest  concentration of SARS-CoV-2 viral copies this assay can detect is 250  copies / mL. A negative result does not preclude SARS-CoV-2 infection  and should not be used as the sole basis for treatment or other  patient management decisions.  A negative result may occur with  improper specimen collection / handling, submission of specimen other  than nasopharyngeal swab, presence of viral mutation(s) within the  areas targeted by this assay, and inadequate number of viral copies  (<  250 copies / mL). A negative result must be combined with clinical  observations, patient history, and epidemiological information. If result is POSITIVE SARS-CoV-2 target nucleic acids are DETECTED. The SARS-CoV-2 RNA is generally detectable in upper and lower  respiratory specimens dur ing the acute phase of infection.  Positive  results are indicative of active infection with SARS-CoV-2.  Clinical  correlation with patient history and other diagnostic information is  necessary to determine patient infection status.  Positive results do  not rule out bacterial infection or co-infection with other viruses. If result is PRESUMPTIVE POSTIVE SARS-CoV-2 nucleic acids MAY BE PRESENT.   A presumptive positive result was obtained on the submitted specimen  and confirmed on repeat testing.  While 2019 novel coronavirus  (SARS-CoV-2) nucleic acids may be present in the submitted sample  additional confirmatory testing may be necessary for epidemiological  and / or clinical management purposes  to differentiate between  SARS-CoV-2 and other Sarbecovirus currently known to infect humans.  If clinically indicated additional testing with an alternate test  methodology [phone]) is advised. The SARS-CoV-2 RNA is generally  detectable  in upper and lower respiratory sp ecimens during the acute  phase of infection. The expected result is Negative. Fact Sheet for Patients:  StrictlyIdeas.no Fact Sheet for Healthcare Providers: BankingDealers.co.za This test is not yet approved or cleared by the [place] FDA and has been authorized for detection and/or diagnosis of SARS-CoV-2 by FDA under an Emergency Use Authorization (EUA).  This EUA will remain in effect (meaning this test can be used) for the duration of the COVID-19 declaration under Section 564(b)(1) of the Act, 21 U.S.C. section 360bbb-3(b)(1), unless the authorization is terminated or revoked sooner. Performed at [facility], [address] [place], [place] [address]   MRSA PCR Screening     Status: None   Collection Time: 05/09/2019 10:30 PM  Result Value Ref Range Status   MRSA by PCR NEGATIVE NEGATIVE Final    Comment:        The GeneXpert MRSA Assay (FDA approved for NASAL specimens only), is one component of a comprehensive MRSA colonization surveillance program. It is not intended to diagnose MRSA infection nor to guide or monitor treatment for MRSA infections. Performed at [facility], [address], [place],  [address]     Studies/Results: Ct Angio Chest Pe W/cm &/or Wo Cm  Result Date: 05/04/2019 CLINICAL DATA:  History of deep venous thrombosis. EXAM: CT ANGIOGRAPHY CHEST WITH CONTRAST TECHNIQUE: Multidetector CT imaging of the chest was performed using the standard protocol during bolus administration of intravenous contrast. Multiplanar CT image reconstructions and MIPs were obtained to evaluate the vascular anatomy. CONTRAST:  178mL OMNIPAQUE IOHEXOL 350 MG/ML SOLN COMPARISON:  Radiograph of same day.  CT scan of April 11, 2011. FINDINGS: Cardiovascular: Atherosclerosis of thoracic aorta is noted without aneurysm or dissection. Large filling defects are noted in the lower lobe branches  of the right pulmonary artery consistent with acute pulmonary emboli. Linear saddle embolus is also noted at the bifurcation of the main pulmonary artery. Normal cardiac size. No pericardial effusion. RV/LV ratio of 0.8 is measured which is within normal limits. Mediastinum/Nodes: Large sliding-type hiatal hernia is noted. Large left thyroid mass is noted measuring 4.1 x 2.8 cm. No significant adenopathy is noted. Lungs/Pleura: No pneumothorax or pleural effusion is noted. Left lung is clear. Mild focal peripheral opacity is noted laterally in the right lower lobe which may represent some degree of pulmonary infarction. Upper Abdomen: Cholelithiasis  is noted. Musculoskeletal: No chest wall abnormality. No acute or significant osseous findings. Review of the MIP images confirms the above findings. IMPRESSION: Multiple pulmonary emboli are noted in the lower lobe branches of the right pulmonary artery, with linear saddle embolus seen at the bifurcation main pulmonary artery. Mild peripheral opacity is noted laterally in the right lower lobe which may represent pulmonary infarction. Critical Value/emergent results were called by telephone at the time of interpretation on 05/10/2019 at 9:12 pm to Dr. Wilson Singer, who verbally acknowledged these results. Large sliding-type hiatal hernia. Large left thyroid mass is noted which was present on prior exam of 2012. Cholelithiasis. Aortic Atherosclerosis (ICD10-I70.0). Electronically Signed   By: Marijo Conception M.D.   On: 05/15/2019 21:12   US Venous Img Lower Unilateral Right  Result Date: 05/13/2019 CLINICAL DATA:  Right leg pain and swelling. EXAM: Right LOWER EXTREMITY VENOUS DOPPLER ULTRASOUND TECHNIQUE: Gray-scale sonography with graded compression, as well as color Doppler and duplex ultrasound were performed to evaluate the lower extremity deep venous systems from the level of the common femoral vein and including the common femoral, femoral, profunda femoral, popliteal  and calf veins including the posterior tibial, peroneal and gastrocnemius veins when visible. The superficial great saphenous vein was also interrogated. Spectral Doppler was utilized to evaluate flow at rest and with distal augmentation maneuvers in the common femoral, femoral and popliteal veins. COMPARISON:  None. FINDINGS: Contralateral Common Femoral Vein: Respiratory phasicity is normal and symmetric with the symptomatic side. No evidence of thrombus. Normal compressibility. Common Femoral Vein: Noncompressible with no flow consistent with occlusive thrombus. Saphenofemoral Junction: Noncompressible with no flow consistent with occlusive thrombus. Profunda Femoral Vein: Noncompressible with no flow consistent with occlusive thrombus. Femoral Vein: Noncompressible with no flow consistent with occlusive thrombus. Popliteal Vein: Non occlusive with no flow consistent with occlusive thrombus. Calf Veins: Peroneal vein is not visualized. Superficial Great Saphenous Vein: No evidence of thrombus. Normal compressibility. Venous Reflux:  None. Other Findings:  None. IMPRESSION: Deep venous thrombosis is seen involving the right common femoral, profunda femoral, superficial femoral and popliteal veins. These results will be called to the ordering clinician or representative by the Radiologist Assistant, and communication documented in the PACS or zVision Dashboard. Electronically Signed   By: Marijo Conception M.D.   On: 05/08/2019 17:41   Dg Chest Port 1 View  Result Date: 29-May-2019 CLINICAL DATA:  85 year old female with acute pulmonary emboli. Intubated. EXAM: PORTABLE CHEST 1 VIEW COMPARISON:  CTA chest and portable chest yesterday. FINDINGS: Portable AP supine view at 0715 hours. Endotracheal tube tip is at the carina. The patient is rotated to the left. Retrocardiac hypo ventilation appears stable. No pneumothorax or pulmonary edema. Stable cardiac size and mediastinal contours. Paucity of bowel gas. Excreted  IV contrast in the right renal collecting system. IMPRESSION: 1. Endotracheal tube tip at the carina. Retract 1-2 cm for optimal placement. 2. No new cardiopulmonary abnormality. Electronically Signed   By: Genevie Ann M.D.   On: 05-29-2019 07:49   Dg Chest Port 1 View  Result Date: 05/08/2019 CLINICAL DATA:  Right lower extremity swelling for 1 week. EXAM: PORTABLE CHEST 1 VIEW COMPARISON:  Single-view of the chest 12/06/2017. FINDINGS: Lungs are clear. Heart size is normal. Hiatal hernia is again seen. No pneumothorax or pleural effusion. Aortic atherosclerosis is noted. No acute or focal bony abnormality. IMPRESSION: No acute disease. Small hiatal hernia. Atherosclerosis. Electronically Signed   By: Inge Rise M.D.   On: 05/04/2019 18:40  Medications:  Prior to Admission:  Medications Prior to Admission  Medication Sig Dispense Refill Last Dose  . Cholecalciferol (VITAMIN D PO) Take 1 tablet by mouth daily.   05/20/2019 at Unknown time  . donepezil (ARICEPT) 5 MG tablet Take 1 tablet by mouth daily.   05/03/2019 at Unknown time  . furosemide (LASIX) 40 MG tablet Take 40 mg by mouth.   05/03/2019 at Unknown time  . pantoprazole (PROTONIX) 40 MG tablet Take 40 mg by mouth daily.  0 05/19/2019 at Unknown time  . ciprofloxacin (CIPRO) 500 MG tablet Take 1 tablet (500 mg total) by mouth 2 (two) times daily. (Patient not taking: Reported on 12/13/2018) 14 tablet 0 Not Taking at Unknown time  . lubiprostone (AMITIZA) 8 MCG capsule Take 1 capsule (8 mcg total) by mouth 2 (two) times daily with a meal. (Patient not taking: Reported on 12/13/2018) 60 capsule 1 Not Taking at Unknown time  . methocarbamol (ROBAXIN) 500 MG tablet Take 1 tablet (500 mg total) by mouth 2 (two) times daily as needed for muscle spasms. (Patient not taking: Reported on 05/16/2019) 10 tablet 0 Not Taking at Unknown time  . metroNIDAZOLE (FLAGYL) 500 MG tablet Take 1 tablet (500 mg total) by mouth 2 (two) times daily. (Patient not  taking: Reported on 12/13/2018) 14 tablet 0 Not Taking at Unknown time   Scheduled:  Continuous: . sodium chloride 50 mL/hr at 05-15-2019 0543  . diltiazem (CARDIZEM) infusion 5 mg/hr (05/15/2019 0543)  . heparin 850 Units/hr (May 15, 2019 0543)  . norepinephrine (LEVOPHED) Adult infusion 11 mcg/min (May 15, 2019 0751)  . potassium chloride     LPF:[id number] **OR** acetaminophen, fentaNYL (SUBLIMAZE) injection, prochlorperazine  Assesment: She was admitted with multiple problems including DVT and pulmonary emboli.  She has substantial pulmonary emboli seen on CT which I have personally reviewed.  She did not however show right ventricular strain pattern she has a hiatal hernia on the CT and she also has a large thyroid mass that is about 4 cm and looks like it may deflect the trachea a little bit.  She had atrial fib with rapid ventricular response and her ventricular response is better and I cannot tell by exam if she is in atrial fib or not now.  There is extensive artifact.  She has been hypotensive and she is on pressor support now.  With the wild fluctuations in her blood pressure it may be helpful to put in an arterial line  She has hypokalemia which will be replaced  Her endotracheal tube is at the carina on chest x-ray which I have personally reviewed and will be retracted by about 3 cm  She had respiratory arrest and is now intubated and on mechanical ventilation  At baseline she apparently has dementia  She is critically ill and I doubt she will survive this episode Principal Problem:   Atrial fibrillation with RVR ([facility]) Active Problems:   Hyperlipidemia   Dementia ([facility])   Acid reflux   Hypokalemia   Acute deep vein thrombosis (DVT) of right lower extremity ([facility])   Pulmonary emboli ([facility])   Pressure injury of skin    Plan: Arterial line.  Retract endotracheal tube.  Continue support.  No CODE BLUE status is appropriate continue with pressors as needed  Thanks for allowing  me to see her with you    LOS: 1 day   Alonza Bogus 05/15/2019, 7:54 AM

## 2019-05-22 NOTE — Care Management Important Message (Signed)
Important Message  Patient Details  Name: Marisa Lawrence MRN: [id number] Date of Birth: 08/21/1928   Medicare Important Message Given:  Yes    [name] 2019-05-31, 1:19 PM

## 2019-05-22 NOTE — Procedures (Signed)
Arterial Catheter Insertion Procedure Note GENEVIENE TESCH [id number] 03-May-1928  Procedure: Insertion of Arterial Catheter  Indications: for constant BP monitoring  Procedure Details Consent: emergent situation  Time Out: Verified patient identification, verified procedure, site/side was marked, verified correct patient position, special equipment/implants available, medications/allergies/relevent history reviewed, required imaging and test results available. 0940.  Maximum sterile technique was used including sterile gloves  Skin prep: chlorhexidine was used on site.; NO local anesthetic administered  20 catheter was inserted into right radial artery using the Seldinger technique.  Evaluation Blood flow was good.  BP tracing to be expected. 62/56 Complications: very difficult to place due to such low blood pressure. Otherwise no complications.    Lucianne Muss 2019-05-21

## 2019-05-22 NOTE — Procedures (Signed)
Intubation Procedure Note Marisa Lawrence [id number] January 02, 1928  Procedure: Intubation Indications: Respiratory insufficiency  Procedure Details Consent: Unable to obtain consent because of emergent medical necessity. Time Out: Verified patient identification, verified procedure, site/side was marked, verified correct patient position, special equipment/implants available, medications/allergies/relevent history reviewed, required imaging and test results available.  Performed  Maximum sterile technique was used including gloves, hand hygiene and mask.  MAC and 3    Evaluation Hemodynamic Status: BP stable throughout; O2 sats: currently acceptable Patient's Current Condition: stable Complications: No apparent complications Patient did tolerate procedure well. Chest X-ray ordered to verify placement.  CXR: pending.   Marisa Lawrence May 26, 2019

## 2019-05-22 NOTE — Progress Notes (Signed)
Night shift stepdown coverage note.  The patient was seen due to agonal breathing and hypotension.  She was subsequently intubated.  She was started on a Levophed infusion.  Her daughter, Ms. Soledad Gerlach, was notified of these events.  She decided to make the patient DNR and will be bringing the patient's living well to try to determine the best course of action.  Comfort/palliative versus full care.  Tennis Must, MD.

## 2019-05-22 NOTE — Procedures (Signed)
Extubation Procedure Note Terminal extubation per family request and Dr. Anson Fret order Patient Details:   Name: Marisa Lawrence DOB: Apr 23, 1928 MRN: [id number]   Airway Documentation:   Oral cavity and ETT suctioned pre-extubation.  Vent end date: May 29, 2019 Vent end time:1025 Patient placed on  4L [place] post extubation.  Evaluation  O2 sats: stable throughout Complications: No apparent complications Patient did tolerate procedure well. Bilateral Breath Sounds: Clear   No  Pete Pelt 05/29/19, 10:35 AM

## 2019-05-22 NOTE — Progress Notes (Signed)
Noted low BP on camera check. Hardin Negus monitor has been having connection problems all morning. Called to bedside RN to ensure that they were aware. Levophed titrated up per order by bedside staff.

## 2019-05-22 NOTE — Progress Notes (Addendum)
  ANTICOAGULATION CONSULT NOTE -   Pharmacy Consult for heparin Indication: VTE treatment  Allergies  Allergen Reactions  . Codeine     Patient Measurements: Height: 5\' 3"  (160 cm) Weight: 100 lb 1.4 oz (45.4 kg) IBW/kg (Calculated) : 52.4 Heparin Dosing Weight:  Vital Signs: Temp: 99.2 F (37.3 C) (05/15 0400) Temp Source: Oral (05/15 0400) BP: 132/92 (05/15 0600) Pulse Rate: 109 (05/15 0300)  Labs: Recent Labs    04/23/2019 1800 05-20-19 0455  HGB 14.7 13.6  HCT 45.2 42.0  PLT 269 176  LABPROT 12.8  --   INR 1.0  --   HEPARINUNFRC  --  1.22*  CREATININE 0.93 1.57*  TROPONINI 0.04*  --     Estimated Creatinine Clearance: 17.4 mL/min (A) (by C-G formula based on SCr of 1.57 mg/dL (H)).   Medical History: Past Medical History:  Diagnosis Date  . Acid reflux   . Dementia ([place])   . Hyperlipidemia     Medications:  Medications Prior to Admission  Medication Sig Dispense Refill Last Dose  . Cholecalciferol (VITAMIN D PO) Take 1 tablet by mouth daily.   05/02/2019 at Unknown time  . donepezil (ARICEPT) 5 MG tablet Take 1 tablet by mouth daily.   05/03/2019 at Unknown time  . furosemide (LASIX) 40 MG tablet Take 40 mg by mouth.   05/15/2019 at Unknown time  . pantoprazole (PROTONIX) 40 MG tablet Take 40 mg by mouth daily.  0 05/02/2019 at Unknown time  . ciprofloxacin (CIPRO) 500 MG tablet Take 1 tablet (500 mg total) by mouth 2 (two) times daily. (Patient not taking: Reported on 12/13/2018) 14 tablet 0 Not Taking at Unknown time  . lubiprostone (AMITIZA) 8 MCG capsule Take 1 capsule (8 mcg total) by mouth 2 (two) times daily with a meal. (Patient not taking: Reported on 12/13/2018) 60 capsule 1 Not Taking at Unknown time  . methocarbamol (ROBAXIN) 500 MG tablet Take 1 tablet (500 mg total) by mouth 2 (two) times daily as needed for muscle spasms. (Patient not taking: Reported on 05/04/2019) 10 tablet 0 Not Taking at Unknown time  . metroNIDAZOLE (FLAGYL) 500 MG tablet  Take 1 tablet (500 mg total) by mouth 2 (two) times daily. (Patient not taking: Reported on 12/13/2018) 14 tablet 0 Not Taking at Unknown time    Assessment: Pharmacy consulted to dose heparin in patient for VTE treatment.  Patient has no anticoagulants listed on prior to admission medications.  Heparin level supratherapeutic at 1.22  Goal of Therapy:  Heparin level 0.3-0.7 units/ml Monitor platelets by anticoagulation protocol: Yes   Plan:  Hold heparin infusion for 1 hour. Decrease heparin infusion to 650 units/hr Check anti-Xa level in 8 hours and daily while on heparin Continue to monitor H&H and platelets.   Marisa Lawrence 05/20/19,7:40 AM

## 2019-05-22 DEATH — deceased
